# Patient Record
Sex: Female | Born: 1970
Health system: Southern US, Community
[De-identification: ages and names within clinical notes are randomized; demographics above are authoritative.]

## PROBLEM LIST (undated history)

## (undated) DIAGNOSIS — R519 Headache, unspecified: Secondary | ICD-10-CM

## (undated) DIAGNOSIS — N390 Urinary tract infection, site not specified: Secondary | ICD-10-CM

## (undated) DIAGNOSIS — K219 Gastro-esophageal reflux disease without esophagitis: Secondary | ICD-10-CM

## (undated) DIAGNOSIS — R51 Headache: Secondary | ICD-10-CM

## (undated) DIAGNOSIS — Z8619 Personal history of other infectious and parasitic diseases: Secondary | ICD-10-CM

## (undated) HISTORY — DX: Gastro-esophageal reflux disease without esophagitis: K21.9

## (undated) HISTORY — DX: Headache: R51

## (undated) HISTORY — DX: Urinary tract infection, site not specified: N39.0

## (undated) HISTORY — PX: MANDIBLE SURGERY: SHX707

## (undated) HISTORY — PX: TUBAL LIGATION: SHX77

## (undated) HISTORY — DX: Headache, unspecified: R51.9

## (undated) HISTORY — DX: Personal history of other infectious and parasitic diseases: Z86.19

---

## 2000-03-03 ENCOUNTER — Ambulatory Visit (HOSPITAL_COMMUNITY): Admission: RE | Admit: 2000-03-03 | Discharge: 2000-03-03 | Payer: Self-pay | Admitting: Obstetrics and Gynecology

## 2000-08-22 ENCOUNTER — Other Ambulatory Visit: Admission: RE | Admit: 2000-08-22 | Discharge: 2000-08-22 | Payer: Self-pay | Admitting: Obstetrics and Gynecology

## 2001-08-25 ENCOUNTER — Other Ambulatory Visit: Admission: RE | Admit: 2001-08-25 | Discharge: 2001-08-25 | Payer: Self-pay | Admitting: Obstetrics and Gynecology

## 2002-08-31 ENCOUNTER — Other Ambulatory Visit: Admission: RE | Admit: 2002-08-31 | Discharge: 2002-08-31 | Payer: Self-pay | Admitting: Obstetrics and Gynecology

## 2003-02-23 ENCOUNTER — Emergency Department (HOSPITAL_COMMUNITY): Admission: EM | Admit: 2003-02-23 | Discharge: 2003-02-23 | Payer: Self-pay | Admitting: *Deleted

## 2003-10-16 ENCOUNTER — Other Ambulatory Visit: Admission: RE | Admit: 2003-10-16 | Discharge: 2003-10-16 | Payer: Self-pay | Admitting: Obstetrics and Gynecology

## 2004-11-06 ENCOUNTER — Other Ambulatory Visit: Admission: RE | Admit: 2004-11-06 | Discharge: 2004-11-06 | Payer: Self-pay | Admitting: Obstetrics and Gynecology

## 2010-10-09 ENCOUNTER — Emergency Department (HOSPITAL_COMMUNITY): Payer: 59

## 2010-10-09 ENCOUNTER — Emergency Department (HOSPITAL_COMMUNITY)
Admission: EM | Admit: 2010-10-09 | Discharge: 2010-10-09 | Disposition: A | Discharge status: Home / Self Care | Payer: 59 | Attending: Emergency Medicine | Admitting: Emergency Medicine

## 2010-10-09 DIAGNOSIS — R10816 Epigastric abdominal tenderness: Secondary | ICD-10-CM | POA: Insufficient documentation

## 2010-10-09 DIAGNOSIS — M549 Dorsalgia, unspecified: Secondary | ICD-10-CM | POA: Insufficient documentation

## 2010-10-09 DIAGNOSIS — R1013 Epigastric pain: Secondary | ICD-10-CM | POA: Insufficient documentation

## 2010-10-09 DIAGNOSIS — R079 Chest pain, unspecified: Secondary | ICD-10-CM | POA: Insufficient documentation

## 2010-10-09 LAB — URINALYSIS, ROUTINE W REFLEX MICROSCOPIC
Bilirubin Urine: NEGATIVE
Ketones, ur: NEGATIVE mg/dL
Protein, ur: NEGATIVE mg/dL
Urine Glucose, Fasting: NEGATIVE mg/dL

## 2010-10-09 LAB — COMPREHENSIVE METABOLIC PANEL
ALT: 12 U/L (ref 0–35)
AST: 18 U/L (ref 0–37)
Albumin: 4.5 g/dL (ref 3.5–5.2)
Alkaline Phosphatase: 62 U/L (ref 39–117)
BUN: 7 mg/dL (ref 6–23)
Chloride: 105 mEq/L (ref 96–112)
GFR calc Af Amer: >60 mL/min (ref >60)
Potassium: 3.7 mEq/L (ref 3.5–5.1)
Sodium: 141 mEq/L (ref 135–145)
Total Bilirubin: 0.6 mg/dL (ref 0.3–1.2)
Total Protein: 7.3 g/dL (ref 6.0–8.3)

## 2010-10-09 LAB — POCT CARDIAC MARKERS
CKMB, poc: <1 ng/mL — ABNORMAL LOW (ref 1.0–8.0)
CKMB, poc: <1 ng/mL — ABNORMAL LOW (ref 1.0–8.0)
Myoglobin, poc: 29.5 ng/mL (ref 12–200)
Troponin i, poc: <0.05 ng/mL (ref 0.00–0.09)
Troponin i, poc: <0.05 ng/mL (ref 0.00–0.09)
Troponin i, poc: <0.05 ng/mL (ref 0.00–0.09)

## 2010-10-09 LAB — D-DIMER, QUANTITATIVE: D-Dimer, Quant: <0.22 ug/mL-FEU (ref 0.00–0.48)

## 2010-10-09 MED ORDER — IOHEXOL 300 MG/ML  SOLN: 100.0000 mL | Freq: Once | INTRAMUSCULAR | Status: DC | PRN

## 2016-01-22 DIAGNOSIS — Z01419 Encounter for gynecological examination (general) (routine) without abnormal findings: Secondary | ICD-10-CM | POA: Diagnosis not present

## 2016-01-22 DIAGNOSIS — Z6823 Body mass index (BMI) 23.0-23.9, adult: Secondary | ICD-10-CM | POA: Diagnosis not present

## 2016-01-22 DIAGNOSIS — Z1231 Encounter for screening mammogram for malignant neoplasm of breast: Secondary | ICD-10-CM | POA: Diagnosis not present

## 2016-02-02 DIAGNOSIS — Z0000 Encounter for general adult medical examination without abnormal findings: Secondary | ICD-10-CM | POA: Diagnosis not present

## 2016-02-05 DIAGNOSIS — Z0000 Encounter for general adult medical examination without abnormal findings: Secondary | ICD-10-CM | POA: Diagnosis not present

## 2016-06-22 DIAGNOSIS — H5213 Myopia, bilateral: Secondary | ICD-10-CM | POA: Diagnosis not present

## 2016-06-23 DIAGNOSIS — J069 Acute upper respiratory infection, unspecified: Secondary | ICD-10-CM | POA: Diagnosis not present

## 2017-04-20 DIAGNOSIS — Z01419 Encounter for gynecological examination (general) (routine) without abnormal findings: Secondary | ICD-10-CM | POA: Diagnosis not present

## 2017-04-20 DIAGNOSIS — Z1231 Encounter for screening mammogram for malignant neoplasm of breast: Secondary | ICD-10-CM | POA: Diagnosis not present

## 2017-04-20 DIAGNOSIS — Z6824 Body mass index (BMI) 24.0-24.9, adult: Secondary | ICD-10-CM | POA: Diagnosis not present

## 2017-04-20 LAB — HM PAP SMEAR

## 2017-04-20 LAB — HM MAMMOGRAPHY: HM MAMMO: NORMAL (ref 0–4)

## 2017-05-13 ENCOUNTER — Ambulatory Visit (INDEPENDENT_AMBULATORY_CARE_PROVIDER_SITE_OTHER): Payer: 59 | Admitting: Family Medicine

## 2017-05-13 ENCOUNTER — Encounter: Payer: Self-pay | Admitting: Family Medicine

## 2017-05-13 DIAGNOSIS — Z0000 Encounter for general adult medical examination without abnormal findings: Secondary | ICD-10-CM | POA: Diagnosis not present

## 2017-05-13 NOTE — Assessment & Plan Note (Signed)
Pt's PE WNL.  UTD on GYN and Tdap.  Check labs.  Anticipatory guidance provided.

## 2017-05-13 NOTE — Patient Instructions (Signed)
Follow up in 1 year or as needed We'll notify you of your lab results and make any changes if needed Keep up the good work on healthy diet and regular exercise- you look great! Call with any questions or concerns Welcome!  We're glad to have you!!! 

## 2017-05-13 NOTE — Progress Notes (Signed)
   Subjective:    Patient ID: Rhonda Dennis, female    DOB: 09/06/1970, 46 y.o.   MRN: [id number]  HPI New to establish.  Previous MD- Tollie Pizza ([facility])  GYN- [facility]  CPE- UTD on Tdap.  Will get flu shot at work.  No concerns today   Review of Systems Patient reports no vision/ hearing changes, adenopathy,fever, weight change,  persistant/recurrent hoarseness , swallowing issues, chest pain, palpitations, edema, persistant/recurrent cough, hemoptysis, dyspnea (rest/exertional/paroxysmal nocturnal), gastrointestinal bleeding (melena, rectal bleeding), abdominal pain, significant heartburn, bowel changes, GU symptoms (dysuria, hematuria, incontinence), Gyn symptoms (abnormal  bleeding, pain),  syncope, focal weakness, memory loss, numbness & tingling, skin/hair/nail changes, abnormal bruising or bleeding, anxiety, or depression.     Objective:   Physical Exam General Appearance:    Alert, cooperative, no distress, appears stated age  Head:    Normocephalic, without obvious abnormality, atraumatic  Eyes:    PERRL, conjunctiva/corneas clear, EOM's intact, fundi    benign, both eyes.  L upper eyelid overhangs L eye  Ears:    Normal TM's and external ear canals, both ears  Nose:   Nares normal, septum midline, mucosa normal, no drainage    or sinus tenderness  Throat:   Lips, mucosa, and tongue normal; teeth and gums normal  Neck:   Supple, symmetrical, trachea midline, no adenopathy;    Thyroid: no enlargement/tenderness/nodules  Back:     Symmetric, no curvature, ROM normal, no CVA tenderness  Lungs:     Clear to auscultation bilaterally, respirations unlabored  Chest Wall:    No tenderness or deformity   Heart:    Regular rate and rhythm, S1 and S2 normal, no murmur, rub   or gallop  Breast Exam:    Deferred to GYN  Abdomen:     Soft, non-tender, bowel sounds active all four quadrants,    no masses, no organomegaly  Genitalia:    Deferred to GYN  Rectal:    Extremities:    Extremities normal, atraumatic, no cyanosis or edema  Pulses:   2+ and symmetric all extremities  Skin:   Skin color, texture, turgor normal, no rashes or lesions  Lymph nodes:   Cervical, supraclavicular, and axillary nodes normal  Neurologic:   CNII-XII intact, normal strength, sensation and reflexes    throughout          Assessment & Plan:

## 2017-05-13 NOTE — Progress Notes (Signed)
Pre visit review using our clinic review tool, if applicable. No additional management support is needed unless otherwise documented below in the visit note. 

## 2017-05-14 LAB — HEPATIC FUNCTION PANEL
AG RATIO: 1.8 (calc) (ref 1.0–2.5)
ALT: 12 U/L (ref 6–29)
AST: 15 U/L (ref 10–35)
Albumin: 4.6 g/dL (ref 3.6–5.1)
Alkaline phosphatase (APISO): 70 U/L (ref 33–115)
BILIRUBIN TOTAL: 0.6 mg/dL (ref 0.2–1.2)
Bilirubin, Direct: 0.1 mg/dL (ref 0.0–0.2)
Globulin: 2.5 g/dL (calc) (ref 1.9–3.7)
Indirect Bilirubin: 0.5 mg/dL (calc) (ref 0.2–1.2)
TOTAL PROTEIN: 7.1 g/dL (ref 6.1–8.1)

## 2017-05-14 LAB — CBC
HCT: 40.7 % (ref 35.0–45.0)
HEMOGLOBIN: 13.5 g/dL (ref 11.7–15.5)
MCH: 30.3 pg (ref 27.0–33.0)
MCHC: 33.2 g/dL (ref 32.0–36.0)
MCV: 91.3 fL (ref 80.0–100.0)
MPV: 9.6 fL (ref 7.5–12.5)
Platelets: 358 10*3/uL (ref 140–400)
RBC: 4.46 10*6/uL (ref 3.80–5.10)
RDW: 12 % (ref 11.0–15.0)
WBC: 7.1 10*3/uL (ref 3.8–10.8)

## 2017-05-14 LAB — LIPID PANEL
CHOL/HDL RATIO: 2.7 (calc) (ref <5.0)
CHOLESTEROL: 193 mg/dL (ref <200)
HDL: 72 mg/dL (ref >50)
LDL Cholesterol (Calc): 104 mg/dL (calc) — ABNORMAL HIGH
NON-HDL CHOLESTEROL (CALC): 121 mg/dL (ref <130)
TRIGLYCERIDES: 83 mg/dL (ref <150)

## 2017-05-14 LAB — BASIC METABOLIC PANEL
BUN: 11 mg/dL (ref 7–25)
CALCIUM: 9.6 mg/dL (ref 8.6–10.2)
CO2: 28 mmol/L (ref 20–32)
Chloride: 101 mmol/L (ref 98–110)
Creat: 0.75 mg/dL (ref 0.50–1.10)
Glucose, Bld: 77 mg/dL (ref 65–99)
POTASSIUM: 4 mmol/L (ref 3.5–5.3)
SODIUM: 139 mmol/L (ref 135–146)

## 2017-05-14 LAB — TSH: TSH: 0.79 mIU/L

## 2017-05-16 ENCOUNTER — Encounter: Payer: Self-pay | Admitting: General Practice

## 2017-06-23 DIAGNOSIS — H5213 Myopia, bilateral: Secondary | ICD-10-CM | POA: Diagnosis not present

## 2017-06-23 DIAGNOSIS — H524 Presbyopia: Secondary | ICD-10-CM | POA: Diagnosis not present

## 2018-05-15 ENCOUNTER — Ambulatory Visit (INDEPENDENT_AMBULATORY_CARE_PROVIDER_SITE_OTHER): Payer: 59 | Admitting: Family Medicine

## 2018-05-15 ENCOUNTER — Encounter: Payer: Self-pay | Admitting: Family Medicine

## 2018-05-15 ENCOUNTER — Other Ambulatory Visit: Payer: Self-pay

## 2018-05-15 VITALS — BP 119/78 | HR 72 | Temp 97.9°F | Resp 16 | Ht 67.0 in | Wt 149.5 lb

## 2018-05-15 DIAGNOSIS — E785 Hyperlipidemia, unspecified: Secondary | ICD-10-CM

## 2018-05-15 DIAGNOSIS — Z0000 Encounter for general adult medical examination without abnormal findings: Secondary | ICD-10-CM

## 2018-05-15 LAB — HEPATIC FUNCTION PANEL
ALBUMIN: 4.4 g/dL (ref 3.5–5.2)
ALK PHOS: 77 U/L (ref 39–117)
ALT: 13 U/L (ref 0–35)
AST: 14 U/L (ref 0–37)
Bilirubin, Direct: 0.1 mg/dL (ref 0.0–0.3)
TOTAL PROTEIN: 6.8 g/dL (ref 6.0–8.3)
Total Bilirubin: 0.7 mg/dL (ref 0.2–1.2)

## 2018-05-15 LAB — CBC WITH DIFFERENTIAL/PLATELET
Basophils Absolute: 0 10*3/uL (ref 0.0–0.1)
Basophils Relative: 0.7 % (ref 0.0–3.0)
EOS PCT: 1.2 % (ref 0.0–5.0)
Eosinophils Absolute: 0.1 10*3/uL (ref 0.0–0.7)
HCT: 37.8 % (ref 36.0–46.0)
Hemoglobin: 12.9 g/dL (ref 12.0–15.0)
LYMPHS ABS: 1.7 10*3/uL (ref 0.7–4.0)
Lymphocytes Relative: 25.6 % (ref 12.0–46.0)
MCHC: 34 g/dL (ref 30.0–36.0)
MCV: 91.9 fl (ref 78.0–100.0)
Monocytes Absolute: 0.5 10*3/uL (ref 0.1–1.0)
Monocytes Relative: 7 % (ref 3.0–12.0)
NEUTROS ABS: 4.2 10*3/uL (ref 1.4–7.7)
NEUTROS PCT: 65.5 % (ref 43.0–77.0)
PLATELETS: 353 10*3/uL (ref 150.0–400.0)
RBC: 4.11 Mil/uL (ref 3.87–5.11)
RDW: 13.4 % (ref 11.5–15.5)
WBC: 6.5 10*3/uL (ref 4.0–10.5)

## 2018-05-15 LAB — TSH: TSH: 0.84 u[IU]/mL (ref 0.35–4.50)

## 2018-05-15 LAB — BASIC METABOLIC PANEL
BUN: 15 mg/dL (ref 6–23)
CHLORIDE: 103 meq/L (ref 96–112)
CO2: 28 mEq/L (ref 19–32)
Calcium: 9.5 mg/dL (ref 8.4–10.5)
Creatinine, Ser: 0.7 mg/dL (ref 0.40–1.20)
GFR: 95.19 mL/min (ref >60.00)
GLUCOSE: 81 mg/dL (ref 70–99)
POTASSIUM: 4 meq/L (ref 3.5–5.1)
Sodium: 138 mEq/L (ref 135–145)

## 2018-05-15 LAB — LIPID PANEL
Cholesterol: 170 mg/dL (ref 0–200)
HDL: 58.6 mg/dL (ref >39.00)
LDL Cholesterol: 100 mg/dL — ABNORMAL HIGH (ref 0–99)
NonHDL: 111.63
TRIGLYCERIDES: 57 mg/dL (ref 0.0–149.0)
Total CHOL/HDL Ratio: 3
VLDL: 11.4 mg/dL (ref 0.0–40.0)

## 2018-05-15 NOTE — Patient Instructions (Signed)
Follow up in 1 year or as needed We'll notify you of your lab results and make any changes if needed Keep up the good work on healthy diet and regular exercise- you look great!!! Call with any questions or concerns Enjoy your [facility] Status!!!

## 2018-05-15 NOTE — Progress Notes (Signed)
   Subjective:    Patient ID: Rhonda Dennis, female    DOB: 25-Aug-1970, 47 y.o.   MRN: [id number]  HPI CPE- UTD on pap.  Due for mammo.  Scheduled w/ GYN in Dec.  UTD on flu and Tdap.   Review of Systems Patient reports no vision/ hearing changes, adenopathy,fever, weight change,  persistant/recurrent hoarseness , swallowing issues, chest pain, palpitations, edema, persistant/recurrent cough, hemoptysis, dyspnea (rest/exertional/paroxysmal nocturnal), gastrointestinal bleeding (melena, rectal bleeding), abdominal pain, significant heartburn, bowel changes, GU symptoms (dysuria, hematuria, incontinence), Gyn symptoms (abnormal  bleeding, pain),  syncope, focal weakness, memory loss, numbness & tingling, skin/hair/nail changes, abnormal bruising or bleeding, anxiety, or depression.     Objective:   Physical Exam General Appearance:    Alert, cooperative, no distress, appears stated age  Head:    Normocephalic, without obvious abnormality, atraumatic  Eyes:    PERRL, conjunctiva/corneas clear, EOM's intact, fundi    benign, both eyes  Ears:    Normal TM's and external ear canals, both ears  Nose:   Nares normal, septum midline, mucosa normal, no drainage    or sinus tenderness  Throat:   Lips, mucosa, and tongue normal; teeth and gums normal  Neck:   Supple, symmetrical, trachea midline, no adenopathy;    Thyroid: no enlargement/tenderness/nodules  Back:     Symmetric, no curvature, ROM normal, no CVA tenderness  Lungs:     Clear to auscultation bilaterally, respirations unlabored  Chest Wall:    No tenderness or deformity   Heart:    Regular rate and rhythm, S1 and S2 normal, no murmur, rub   or gallop  Breast Exam:    Deferred to GYN  Abdomen:     Soft, non-tender, bowel sounds active all four quadrants,    no masses, no organomegaly  Genitalia:    Deferred to GYN  Rectal:    Extremities:   Extremities normal, atraumatic, no cyanosis or edema  Pulses:   2+ and symmetric all  extremities  Skin:   Skin color, texture, turgor normal, no rashes or lesions  Lymph nodes:   Cervical, supraclavicular, and axillary nodes normal  Neurologic:   CNII-XII intact, normal strength, sensation and reflexes    throughout          Assessment & Plan:

## 2018-05-15 NOTE — Assessment & Plan Note (Signed)
Pt's PE WNL.  UTD on GYN.  UTD on immunizations.  Check labs.  Anticipatory guidance provided.  

## 2018-05-16 ENCOUNTER — Encounter: Payer: Self-pay | Admitting: General Practice

## 2018-07-25 DIAGNOSIS — H524 Presbyopia: Secondary | ICD-10-CM | POA: Diagnosis not present

## 2018-07-25 DIAGNOSIS — H5213 Myopia, bilateral: Secondary | ICD-10-CM | POA: Diagnosis not present

## 2018-09-13 DIAGNOSIS — Z6825 Body mass index (BMI) 25.0-25.9, adult: Secondary | ICD-10-CM | POA: Diagnosis not present

## 2018-09-13 DIAGNOSIS — Z1231 Encounter for screening mammogram for malignant neoplasm of breast: Secondary | ICD-10-CM | POA: Diagnosis not present

## 2018-09-13 DIAGNOSIS — Z01419 Encounter for gynecological examination (general) (routine) without abnormal findings: Secondary | ICD-10-CM | POA: Diagnosis not present

## 2018-09-13 LAB — HM MAMMOGRAPHY

## 2018-09-18 ENCOUNTER — Other Ambulatory Visit: Payer: Self-pay | Admitting: Obstetrics and Gynecology

## 2018-09-18 DIAGNOSIS — R928 Other abnormal and inconclusive findings on diagnostic imaging of breast: Secondary | ICD-10-CM

## 2018-09-21 ENCOUNTER — Ambulatory Visit
Admission: RE | Admit: 2018-09-21 | Discharge: 2018-09-21 | Disposition: A | Discharge status: Home / Self Care | Payer: 59 | Source: Ambulatory Visit | Attending: Obstetrics and Gynecology | Admitting: Obstetrics and Gynecology

## 2018-09-21 ENCOUNTER — Ambulatory Visit: Payer: 59

## 2018-09-21 DIAGNOSIS — R928 Other abnormal and inconclusive findings on diagnostic imaging of breast: Secondary | ICD-10-CM

## 2018-09-21 DIAGNOSIS — R922 Inconclusive mammogram: Secondary | ICD-10-CM | POA: Diagnosis not present

## 2018-10-02 DIAGNOSIS — Z8042 Family history of malignant neoplasm of prostate: Secondary | ICD-10-CM | POA: Diagnosis not present

## 2018-10-02 DIAGNOSIS — Z801 Family history of malignant neoplasm of trachea, bronchus and lung: Secondary | ICD-10-CM | POA: Diagnosis not present

## 2018-10-02 DIAGNOSIS — Z8049 Family history of malignant neoplasm of other genital organs: Secondary | ICD-10-CM | POA: Diagnosis not present

## 2018-10-02 DIAGNOSIS — Z803 Family history of malignant neoplasm of breast: Secondary | ICD-10-CM | POA: Diagnosis not present

## 2019-05-18 ENCOUNTER — Ambulatory Visit (INDEPENDENT_AMBULATORY_CARE_PROVIDER_SITE_OTHER): Payer: 59 | Admitting: Family Medicine

## 2019-05-18 ENCOUNTER — Other Ambulatory Visit: Payer: Self-pay

## 2019-05-18 ENCOUNTER — Encounter: Payer: Self-pay | Admitting: Family Medicine

## 2019-05-18 VITALS — BP 117/81 | HR 76 | Temp 97.9°F | Resp 16 | Ht 67.0 in | Wt 154.2 lb

## 2019-05-18 DIAGNOSIS — E785 Hyperlipidemia, unspecified: Secondary | ICD-10-CM

## 2019-05-18 DIAGNOSIS — Z0000 Encounter for general adult medical examination without abnormal findings: Secondary | ICD-10-CM | POA: Diagnosis not present

## 2019-05-18 LAB — CBC WITH DIFFERENTIAL/PLATELET
Basophils Absolute: 0.1 10*3/uL (ref 0.0–0.1)
Basophils Relative: 0.8 % (ref 0.0–3.0)
Eosinophils Absolute: 0.1 10*3/uL (ref 0.0–0.7)
Eosinophils Relative: 0.9 % (ref 0.0–5.0)
HCT: 41.2 % (ref 36.0–46.0)
Hemoglobin: 13.8 g/dL (ref 12.0–15.0)
Lymphocytes Relative: 18.9 % (ref 12.0–46.0)
Lymphs Abs: 1.8 10*3/uL (ref 0.7–4.0)
MCHC: 33.6 g/dL (ref 30.0–36.0)
MCV: 92.4 fl (ref 78.0–100.0)
Monocytes Absolute: 0.5 10*3/uL (ref 0.1–1.0)
Monocytes Relative: 5.1 % (ref 3.0–12.0)
Neutro Abs: 7.2 10*3/uL (ref 1.4–7.7)
Neutrophils Relative %: 74.3 % (ref 43.0–77.0)
Platelets: 354 10*3/uL (ref 150.0–400.0)
RBC: 4.46 Mil/uL (ref 3.87–5.11)
RDW: 13.3 % (ref 11.5–15.5)
WBC: 9.7 10*3/uL (ref 4.0–10.5)

## 2019-05-18 LAB — BASIC METABOLIC PANEL
BUN: 10 mg/dL (ref 6–23)
CO2: 24 mEq/L (ref 19–32)
Calcium: 10.1 mg/dL (ref 8.4–10.5)
Chloride: 102 mEq/L (ref 96–112)
Creatinine, Ser: 0.7 mg/dL (ref 0.40–1.20)
GFR: 89.18 mL/min (ref >60.00)
Glucose, Bld: 76 mg/dL (ref 70–99)
Potassium: 4.2 mEq/L (ref 3.5–5.1)
Sodium: 138 mEq/L (ref 135–145)

## 2019-05-18 LAB — HEPATIC FUNCTION PANEL
ALT: 10 U/L (ref 0–35)
AST: 13 U/L (ref 0–37)
Albumin: 4.7 g/dL (ref 3.5–5.2)
Alkaline Phosphatase: 78 U/L (ref 39–117)
Bilirubin, Direct: 0.1 mg/dL (ref 0.0–0.3)
Total Bilirubin: 0.9 mg/dL (ref 0.2–1.2)
Total Protein: 7.1 g/dL (ref 6.0–8.3)

## 2019-05-18 LAB — LIPID PANEL
Cholesterol: 185 mg/dL (ref 0–200)
HDL: 59.6 mg/dL (ref >39.00)
LDL Cholesterol: 107 mg/dL — ABNORMAL HIGH (ref 0–99)
NonHDL: 125.29
Total CHOL/HDL Ratio: 3
Triglycerides: 90 mg/dL (ref 0.0–149.0)
VLDL: 18 mg/dL (ref 0.0–40.0)

## 2019-05-18 LAB — TSH: TSH: 0.94 u[IU]/mL (ref 0.35–4.50)

## 2019-05-18 NOTE — Patient Instructions (Signed)
Follow up in 1 year or as needed We'll notify you of your lab results and make any changes if needed Continue to work on healthy diet and regular exercise- you're doing great! Call with any questions or concerns Stay Safe!!! 

## 2019-05-18 NOTE — Progress Notes (Signed)
   Subjective:    Patient ID: Rhonda Dennis, female    DOB: 04/24/1971, 48 y.o.   MRN: [id number]  HPI CPE- UTD on pap, mammo, Tdap.  Had flu shot today at work.  No concerns.   Review of Systems Patient reports no vision/ hearing changes, adenopathy,fever, weight change,  persistant/recurrent hoarseness , swallowing issues, chest pain, palpitations, edema, persistant/recurrent cough, hemoptysis, dyspnea (rest/exertional/paroxysmal nocturnal), gastrointestinal bleeding (melena, rectal bleeding), abdominal pain, significant heartburn, bowel changes, GU symptoms (dysuria, hematuria, incontinence), Gyn symptoms (abnormal  bleeding, pain),  syncope, focal weakness, memory loss, numbness & tingling, skin/hair/nail changes, abnormal bruising or bleeding, anxiety, or depression.     Objective:   Physical Exam General Appearance:    Alert, cooperative, no distress, appears stated age  Head:    Normocephalic, without obvious abnormality, atraumatic  Eyes:    PERRL, conjunctiva/corneas clear, EOM's intact, fundi    benign, both eyes  Ears:    Normal TM's and external ear canals, both ears  Nose:   Deferred due to COVID  Throat:   Neck:   Supple, symmetrical, trachea midline, no adenopathy;    Thyroid: no enlargement/tenderness/nodules  Back:     Symmetric, no curvature, ROM normal, no CVA tenderness  Lungs:     Clear to auscultation bilaterally, respirations unlabored  Chest Wall:    No tenderness or deformity   Heart:    Regular rate and rhythm, S1 and S2 normal, no murmur, rub   or gallop  Breast Exam:    Deferred to GYN  Abdomen:     Soft, non-tender, bowel sounds active all four quadrants,    no masses, no organomegaly  Genitalia:    Deferred to GYN  Rectal:    Extremities:   Extremities normal, atraumatic, no cyanosis or edema  Pulses:   2+ and symmetric all extremities  Skin:   Skin color, texture, turgor normal, no rashes or lesions  Lymph nodes:   Cervical,  supraclavicular, and axillary nodes normal  Neurologic:   CNII-XII intact, normal strength, sensation and reflexes    throughout          Assessment & Plan:

## 2019-05-18 NOTE — Assessment & Plan Note (Signed)
Pt's PE WNL.  UTD on pap, mammo, immunizations.  Check labs.  Anticipatory guidance provided.

## 2019-05-21 ENCOUNTER — Encounter: Payer: Self-pay | Admitting: General Practice

## 2019-08-28 ENCOUNTER — Other Ambulatory Visit: Payer: Self-pay | Admitting: Obstetrics and Gynecology

## 2019-08-28 DIAGNOSIS — Z1231 Encounter for screening mammogram for malignant neoplasm of breast: Secondary | ICD-10-CM

## 2019-09-17 DIAGNOSIS — Z01419 Encounter for gynecological examination (general) (routine) without abnormal findings: Secondary | ICD-10-CM | POA: Diagnosis not present

## 2019-09-17 DIAGNOSIS — Z6825 Body mass index (BMI) 25.0-25.9, adult: Secondary | ICD-10-CM | POA: Diagnosis not present

## 2019-10-09 ENCOUNTER — Ambulatory Visit: Payer: 59

## 2019-11-13 ENCOUNTER — Ambulatory Visit
Admission: RE | Admit: 2019-11-13 | Discharge: 2019-11-13 | Disposition: A | Discharge status: Home / Self Care | Payer: 59 | Source: Ambulatory Visit | Attending: Obstetrics and Gynecology | Admitting: Obstetrics and Gynecology

## 2019-11-13 ENCOUNTER — Other Ambulatory Visit: Payer: Self-pay

## 2019-11-13 DIAGNOSIS — Z1231 Encounter for screening mammogram for malignant neoplasm of breast: Secondary | ICD-10-CM | POA: Diagnosis not present

## 2020-05-23 ENCOUNTER — Other Ambulatory Visit: Payer: Self-pay

## 2020-05-23 ENCOUNTER — Encounter: Payer: Self-pay | Admitting: Family Medicine

## 2020-05-23 ENCOUNTER — Ambulatory Visit (INDEPENDENT_AMBULATORY_CARE_PROVIDER_SITE_OTHER): Payer: 59 | Admitting: Family Medicine

## 2020-05-23 VITALS — BP 116/78 | HR 76 | Temp 98.0°F | Resp 16 | Ht 67.0 in | Wt 150.5 lb

## 2020-05-23 DIAGNOSIS — Z0000 Encounter for general adult medical examination without abnormal findings: Secondary | ICD-10-CM | POA: Diagnosis not present

## 2020-05-23 DIAGNOSIS — E785 Hyperlipidemia, unspecified: Secondary | ICD-10-CM | POA: Diagnosis not present

## 2020-05-23 DIAGNOSIS — Z1211 Encounter for screening for malignant neoplasm of colon: Secondary | ICD-10-CM

## 2020-05-23 LAB — HEPATIC FUNCTION PANEL
ALT: 11 U/L (ref 0–35)
AST: 12 U/L (ref 0–37)
Albumin: 4.4 g/dL (ref 3.5–5.2)
Alkaline Phosphatase: 86 U/L (ref 39–117)
Bilirubin, Direct: 0.1 mg/dL (ref 0.0–0.3)
Total Bilirubin: 0.5 mg/dL (ref 0.2–1.2)
Total Protein: 6.8 g/dL (ref 6.0–8.3)

## 2020-05-23 LAB — TSH: TSH: 1.2 u[IU]/mL (ref 0.35–4.50)

## 2020-05-23 LAB — BASIC METABOLIC PANEL
BUN: 12 mg/dL (ref 6–23)
CO2: 29 mEq/L (ref 19–32)
Calcium: 9.4 mg/dL (ref 8.4–10.5)
Chloride: 102 mEq/L (ref 96–112)
Creatinine, Ser: 0.81 mg/dL (ref 0.40–1.20)
GFR: 75.03 mL/min (ref >60.00)
Glucose, Bld: 82 mg/dL (ref 70–99)
Potassium: 4.2 mEq/L (ref 3.5–5.1)
Sodium: 138 mEq/L (ref 135–145)

## 2020-05-23 LAB — CBC WITH DIFFERENTIAL/PLATELET
Basophils Absolute: 0.1 10*3/uL (ref 0.0–0.1)
Basophils Relative: 1.4 % (ref 0.0–3.0)
Eosinophils Absolute: 0.1 10*3/uL (ref 0.0–0.7)
Eosinophils Relative: 1.8 % (ref 0.0–5.0)
HCT: 40.3 % (ref 36.0–46.0)
Hemoglobin: 13.6 g/dL (ref 12.0–15.0)
Lymphocytes Relative: 23.8 % (ref 12.0–46.0)
Lymphs Abs: 1.4 10*3/uL (ref 0.7–4.0)
MCHC: 33.8 g/dL (ref 30.0–36.0)
MCV: 92 fl (ref 78.0–100.0)
Monocytes Absolute: 0.3 10*3/uL (ref 0.1–1.0)
Monocytes Relative: 5.6 % (ref 3.0–12.0)
Neutro Abs: 4 10*3/uL (ref 1.4–7.7)
Neutrophils Relative %: 67.4 % (ref 43.0–77.0)
Platelets: 328 10*3/uL (ref 150.0–400.0)
RBC: 4.38 Mil/uL (ref 3.87–5.11)
RDW: 13.2 % (ref 11.5–15.5)
WBC: 6 10*3/uL (ref 4.0–10.5)

## 2020-05-23 LAB — LIPID PANEL
Cholesterol: 165 mg/dL (ref 0–200)
HDL: 56.7 mg/dL (ref >39.00)
LDL Cholesterol: 87 mg/dL (ref 0–99)
NonHDL: 108.59
Total CHOL/HDL Ratio: 3
Triglycerides: 109 mg/dL (ref 0.0–149.0)
VLDL: 21.8 mg/dL (ref 0.0–40.0)

## 2020-05-23 NOTE — Assessment & Plan Note (Signed)
Pt's PE WNL.  UTD on GYN, immunizations.  Due for colonoscopy- referral placed.  Check labs.  Anticipatory guidance provided.

## 2020-05-23 NOTE — Progress Notes (Signed)
   Subjective:    Patient ID: Rhonda Dennis, female    DOB: 1970-10-01, 49 y.o.   MRN: [id number]  HPI CPE- UTD pap, mammo, Tdap, COVID vaccines, flu.  Due for colonoscopy.  No concerns today.  Reviewed past medical, surgical, family and social histories.   Patient Care Team    Relationship Specialty Notifications Start End  Midge Minium, MD PCP - General Family Medicine  05/13/17   Arvella Nigh, MD Consulting Physician Obstetrics and Gynecology  05/13/17     Health Maintenance  Topic Date Due  . Hepatitis C Screening  05/23/2021 (Originally 01/16/1971)  . HIV Screening  05/23/2021 (Originally 01/05/1986)  . MAMMOGRAM  11/12/2020  . PAP SMEAR-Modifier  08/23/2022  . TETANUS/TDAP  12/24/2025  . INFLUENZA VACCINE  Completed      Review of Systems Patient reports no vision/ hearing changes, adenopathy,fever, weight change,  persistant/recurrent hoarseness , swallowing issues, chest pain, palpitations, edema, persistant/recurrent cough, hemoptysis, dyspnea (rest/exertional/paroxysmal nocturnal), gastrointestinal bleeding (melena, rectal bleeding), abdominal pain, significant heartburn, bowel changes, GU symptoms (dysuria, hematuria, incontinence), Gyn symptoms (abnormal  bleeding, pain),  syncope, focal weakness, memory loss, numbness & tingling, skin/hair/nail changes, abnormal bruising or bleeding, anxiety, or depression.   This visit occurred during the SARS-CoV-2 public health emergency.  Safety protocols were in place, including screening questions prior to the visit, additional usage of staff PPE, and extensive cleaning of exam room while observing appropriate contact time as indicated for disinfecting solutions.       Objective:   Physical Exam General Appearance:    Alert, cooperative, no distress, appears stated age  Head:    Normocephalic, without obvious abnormality, atraumatic  Eyes:    PERRL, conjunctiva/corneas clear, EOM's intact, fundi    benign, both  eyes  Ears:    Normal TM's and external ear canals, both ears  Nose:   Deferred due to COVID  Throat:   Neck:   Supple, symmetrical, trachea midline, no adenopathy;    Thyroid: no enlargement/tenderness/nodules  Back:     Symmetric, no curvature, ROM normal, no CVA tenderness  Lungs:     Clear to auscultation bilaterally, respirations unlabored  Chest Wall:    No tenderness or deformity   Heart:    Regular rate and rhythm, S1 and S2 normal, no murmur, rub   or gallop  Breast Exam:    Deferred to GYN  Abdomen:     Soft, non-tender, bowel sounds active all four quadrants,    no masses, no organomegaly  Genitalia:    Deferred to GYN  Rectal:    Extremities:   Extremities normal, atraumatic, no cyanosis or edema  Pulses:   2+ and symmetric all extremities  Skin:   Skin color, texture, turgor normal, no rashes or lesions  Lymph nodes:   Cervical, supraclavicular, and axillary nodes normal  Neurologic:   CNII-XII intact, normal strength, sensation and reflexes    throughout          Assessment & Plan:

## 2020-05-23 NOTE — Patient Instructions (Signed)
Follow up in 1 year or as needed We'll notify you of your lab results and make any changes if needed We'll call you with your GI appt Keep up the good work on healthy diet and regular exercise- you look great! Call with any questions or concerns Stay Safe!  Stay Healthy!!

## 2020-05-26 ENCOUNTER — Encounter: Payer: Self-pay | Admitting: General Practice

## 2020-05-29 ENCOUNTER — Other Ambulatory Visit (HOSPITAL_COMMUNITY): Payer: Self-pay | Admitting: Dermatology

## 2020-05-29 DIAGNOSIS — L918 Other hypertrophic disorders of the skin: Secondary | ICD-10-CM | POA: Diagnosis not present

## 2020-05-29 DIAGNOSIS — L814 Other melanin hyperpigmentation: Secondary | ICD-10-CM | POA: Diagnosis not present

## 2020-05-29 DIAGNOSIS — D225 Melanocytic nevi of trunk: Secondary | ICD-10-CM | POA: Diagnosis not present

## 2020-05-29 DIAGNOSIS — D1801 Hemangioma of skin and subcutaneous tissue: Secondary | ICD-10-CM | POA: Diagnosis not present

## 2020-05-29 MED FILL — IMIQUIMOD 5% CREAM PACKET: 5 | 56 days supply | Qty: 24 | Fill #0

## 2020-08-05 ENCOUNTER — Encounter: Payer: Self-pay | Admitting: Gastroenterology

## 2020-09-19 ENCOUNTER — Other Ambulatory Visit: Payer: Self-pay

## 2020-09-19 ENCOUNTER — Ambulatory Visit (AMBULATORY_SURGERY_CENTER): Payer: Self-pay | Admitting: *Deleted

## 2020-09-19 ENCOUNTER — Other Ambulatory Visit: Payer: Self-pay | Admitting: Gastroenterology

## 2020-09-19 VITALS — Ht 67.0 in | Wt 149.0 lb

## 2020-09-19 DIAGNOSIS — Z1211 Encounter for screening for malignant neoplasm of colon: Secondary | ICD-10-CM

## 2020-09-19 MED ORDER — NA SULFATE-K SULFATE-MG SULF 17.5-3.13-1.6 GM/177ML PO SOLN: ORAL | 0 refills | Status: DC

## 2020-09-19 MED FILL — SUPREP BOWEL PREP KIT: 17.5-3.13-1 | 2 days supply | Qty: 354 | Fill #0

## 2020-09-19 NOTE — Progress Notes (Signed)
Patient is here in-person for PV. Patient denies any allergies to eggs or soy. Patient denies any problems with anesthesia/sedation. Patient denies any oxygen use at home. Patient denies taking any diet/weight loss medications or blood thinners. Patient is not being treated for MRSA or C-diff. Patient is aware of our care-partner policy and BXUXY-33 safety protocol. EMMI education assigned to the patient for the procedure, sent to [facility].   COVID-19 vaccines completed on  07/2020 x3, per patient.

## 2020-10-09 ENCOUNTER — Other Ambulatory Visit: Payer: Self-pay | Admitting: Obstetrics and Gynecology

## 2020-10-09 DIAGNOSIS — Z1231 Encounter for screening mammogram for malignant neoplasm of breast: Secondary | ICD-10-CM

## 2020-10-14 DIAGNOSIS — Z01419 Encounter for gynecological examination (general) (routine) without abnormal findings: Secondary | ICD-10-CM | POA: Diagnosis not present

## 2020-10-14 DIAGNOSIS — Z6824 Body mass index (BMI) 24.0-24.9, adult: Secondary | ICD-10-CM | POA: Diagnosis not present

## 2020-10-15 ENCOUNTER — Encounter: Payer: Self-pay | Admitting: Gastroenterology

## 2020-10-17 ENCOUNTER — Encounter: Payer: Self-pay | Admitting: Gastroenterology

## 2020-10-17 ENCOUNTER — Other Ambulatory Visit: Payer: Self-pay

## 2020-10-17 ENCOUNTER — Ambulatory Visit (AMBULATORY_SURGERY_CENTER): Payer: 59 | Admitting: Gastroenterology

## 2020-10-17 VITALS — BP 99/68 | HR 70 | Temp 96.6°F | Resp 16 | Ht 67.0 in | Wt 149.0 lb

## 2020-10-17 DIAGNOSIS — Z1211 Encounter for screening for malignant neoplasm of colon: Secondary | ICD-10-CM | POA: Diagnosis not present

## 2020-10-17 MED ORDER — SODIUM CHLORIDE 0.9 % IV SOLN: 500.0000 mL | Freq: Once | INTRAVENOUS | Status: DC

## 2020-10-17 NOTE — Progress Notes (Signed)
pt tolerated well. VSS. awake and to recovery. Report given to RN.  

## 2020-10-17 NOTE — Patient Instructions (Signed)
YOU HAD AN ENDOSCOPIC PROCEDURE TODAY AT THE [facility]:   Refer to the procedure report that was given to you for any specific questions about what was found during the examination.  If the procedure report does not answer your questions, please call your gastroenterologist to clarify.  If you requested that your care partner not be given the details of your procedure findings, then the procedure report has been included in a sealed envelope for you to review at your convenience later.  YOU SHOULD EXPECT: Some feelings of bloating in the abdomen. Passage of more gas than usual.  Walking can help get rid of the air that was put into your GI tract during the procedure and reduce the bloating. If you had a lower endoscopy (such as a colonoscopy or flexible sigmoidoscopy) you may notice spotting of blood in your stool or on the toilet paper. If you underwent a bowel prep for your procedure, you may not have a normal bowel movement for a few days.  Please Note:  You might notice some irritation and congestion in your nose or some drainage.  This is from the oxygen used during your procedure.  There is no need for concern and it should clear up in a day or so.  SYMPTOMS TO REPORT IMMEDIATELY:   Following lower endoscopy (colonoscopy or flexible sigmoidoscopy):  Excessive amounts of blood in the stool  Significant tenderness or worsening of abdominal pains  Swelling of the abdomen that is new, acute  Fever of 100F or higher   Following upper endoscopy (EGD)  Vomiting of blood or coffee ground material  New chest pain or pain under the shoulder blades  Painful or persistently difficult swallowing  New shortness of breath  Fever of 100F or higher  Black, tarry-looking stools  For urgent or emergent issues, a gastroenterologist can be reached at any hour by calling [phone]. Do not use MyChart messaging for urgent concerns.    DIET:  We do recommend a small meal at first, but  then you may proceed to your regular diet.  Drink plenty of fluids but you should avoid alcoholic beverages for 24 hours.  ACTIVITY:  You should plan to take it easy for the rest of today and you should NOT DRIVE or use heavy machinery until tomorrow (because of the sedation medicines used during the test).    FOLLOW UP: Our staff will call the number listed on your records 48-72 hours following your procedure to check on you and address any questions or concerns that you may have regarding the information given to you following your procedure. If we do not reach you, we will leave a message.  We will attempt to reach you two times.  During this call, we will ask if you have developed any symptoms of COVID 19. If you develop any symptoms (ie: fever, flu-like symptoms, shortness of breath, cough etc.) before then, please call [phone].  If you test positive for Covid 19 in the 2 weeks post procedure, please call and report this information to us.    If any biopsies were taken you will be contacted by phone or by letter within the next 1-3 weeks.  Please call us at [phone] if you have not heard about the biopsies in 3 weeks.    SIGNATURES/CONFIDENTIALITY: You and/or your care partner have signed paperwork which will be entered into your electronic medical record.  These signatures attest to the fact that that the information above on   your After Visit Summary has been reviewed and is understood.  Full responsibility of the confidentiality of this discharge information lies with you and/or your care-partner. 

## 2020-10-17 NOTE — Op Note (Signed)
[facility] Patient Name: Rhonda Dennis Procedure Date: 10/17/2020 8:31 AM MRN: [id number] Endoscopist: Thornton Park MD, MD Age: 50 Referring MD:  Date of Birth: 1970/10/28 Gender: Female Account #: [id number] Procedure:                Colonoscopy Indications:              Screening for colorectal malignant neoplasm, This                            is the patient's first colonoscopy                           Maternal uncle with colon cancer in his 82s                           Mother may have had colon polyps Medicines:                Monitored Anesthesia Care Procedure:                Pre-Anesthesia Assessment:                           - Prior to the procedure, a History and Physical                            was performed, and patient medications and                            allergies were reviewed. The patient's tolerance of                            previous anesthesia was also reviewed. The risks                            and benefits of the procedure and the sedation                            options and risks were discussed with the patient.                            All questions were answered, and informed consent                            was obtained. Prior Anticoagulants: The patient has                            taken no previous anticoagulant or antiplatelet                            agents. ASA Grade Assessment: II - A patient with                            mild systemic disease. After reviewing the risks  and benefits, the patient was deemed in                            satisfactory condition to undergo the procedure.                           After obtaining informed consent, the colonoscope                            was passed under direct vision. Throughout the                            procedure, the patient's blood pressure, pulse, and                            oxygen saturations were monitored  continuously. The                            Olympus PCF-H190DL (#[id number]) Colonoscope was                            introduced through the anus and advanced to the 3                            cm into the ileum. A second forward view of the                            right colon was performed. The colonoscopy was                            performed without difficulty. The patient tolerated                            the procedure well. The quality of the bowel                            preparation was good. The terminal ileum, ileocecal                            valve, appendiceal orifice, and rectum were                            photographed. Scope In: 8:40:21 AM Scope Out: 8:53:16 AM Scope Withdrawal Time: 0 hours 10 minutes 9 seconds  Total Procedure Duration: 0 hours 12 minutes 55 seconds  Findings:                 The perianal and digital rectal examinations were                            normal.                           The entire examined colon appeared normal on direct  and retroflexion views. Complications:            No immediate complications. Estimated Blood Loss:     Estimated blood loss: none. Impression:               - The entire examined colon is normal on direct and                            retroflexion views.                           - No specimens collected. Recommendation:           - Patient has a contact number available for                            emergencies. The signs and symptoms of potential                            delayed complications were discussed with the                            patient. Return to normal activities tomorrow.                            Written discharge instructions were provided to the                            patient.                           - Resume previous diet.                           - Continue present medications.                           - Repeat colonoscopy in 5 years for  surveillance if                            your Mom has a history of colon polyps.                           - Emerging evidence supports eating a diet of                            fruits, vegetables, grains, calcium, and yogurt                            while reducing red meat and alcohol may reduce the                            risk of colon cancer.                           - Thank you for allowing me to be involved in your  colon cancer prevention. Thornton Park MD, MD 10/17/2020 8:57:29 AM This report has been signed electronically.

## 2020-10-17 NOTE — Progress Notes (Signed)
Pt's states no medical or surgical changes since previsit or office visit.  VS taken by SM 

## 2020-10-21 ENCOUNTER — Telehealth: Payer: Self-pay | Admitting: *Deleted

## 2020-10-21 LAB — HM PAP SMEAR: HPV, high-risk: NEGATIVE

## 2020-10-21 NOTE — Telephone Encounter (Signed)
  Follow up Call-  Call back number 10/17/2020  Post procedure Call Back phone  # [phone]  Permission to leave phone message Yes  Some recent data might be hidden     Patient questions:  Do you have a fever, pain , or abdominal swelling? No. Pain Score  0 *  Have you tolerated food without any problems? Yes.    Have you been able to return to your normal activities? Yes.    Do you have any questions about your discharge instructions: Diet   No. Medications  No. Follow up visit  No.  Do you have questions or concerns about your Care? No.  Actions: * If pain score is 4 or above: No action needed, pain <4.  1. Have you developed a fever since your procedure? no  2.   Have you had an respiratory symptoms (SOB or cough) since your procedure? no  3.   Have you tested positive for COVID 19 since your procedure no  4.   Have you had any family members/close contacts diagnosed with the COVID 19 since your procedure?  no   If yes to any of these questions please route to Joylene John, RN and Joella Prince, RN

## 2020-11-28 ENCOUNTER — Ambulatory Visit
Admission: RE | Admit: 2020-11-28 | Discharge: 2020-11-28 | Disposition: A | Discharge status: Home / Self Care | Payer: 59 | Source: Ambulatory Visit | Attending: Obstetrics and Gynecology | Admitting: Obstetrics and Gynecology

## 2020-11-28 ENCOUNTER — Other Ambulatory Visit: Payer: Self-pay

## 2020-11-28 DIAGNOSIS — Z1231 Encounter for screening mammogram for malignant neoplasm of breast: Secondary | ICD-10-CM | POA: Diagnosis not present

## 2020-12-04 DIAGNOSIS — D485 Neoplasm of uncertain behavior of skin: Secondary | ICD-10-CM | POA: Diagnosis not present

## 2021-02-18 ENCOUNTER — Encounter: Payer: Self-pay | Admitting: *Deleted

## 2021-02-25 DIAGNOSIS — H5213 Myopia, bilateral: Secondary | ICD-10-CM | POA: Diagnosis not present

## 2021-05-25 ENCOUNTER — Encounter: Payer: 59 | Admitting: Family Medicine

## 2021-06-02 ENCOUNTER — Encounter: Payer: 59 | Admitting: Family Medicine

## 2021-06-04 DIAGNOSIS — D225 Melanocytic nevi of trunk: Secondary | ICD-10-CM | POA: Diagnosis not present

## 2021-06-04 DIAGNOSIS — D1801 Hemangioma of skin and subcutaneous tissue: Secondary | ICD-10-CM | POA: Diagnosis not present

## 2021-06-04 DIAGNOSIS — L570 Actinic keratosis: Secondary | ICD-10-CM | POA: Diagnosis not present

## 2021-06-04 DIAGNOSIS — L918 Other hypertrophic disorders of the skin: Secondary | ICD-10-CM | POA: Diagnosis not present

## 2021-08-12 ENCOUNTER — Encounter: Payer: Self-pay | Admitting: Family Medicine

## 2021-08-12 ENCOUNTER — Ambulatory Visit (INDEPENDENT_AMBULATORY_CARE_PROVIDER_SITE_OTHER): Payer: 59 | Admitting: Family Medicine

## 2021-08-12 VITALS — BP 126/82 | HR 87 | Temp 97.7°F | Resp 16 | Ht 67.0 in | Wt 152.4 lb

## 2021-08-12 DIAGNOSIS — E785 Hyperlipidemia, unspecified: Secondary | ICD-10-CM

## 2021-08-12 DIAGNOSIS — Z0000 Encounter for general adult medical examination without abnormal findings: Secondary | ICD-10-CM

## 2021-08-12 LAB — CBC WITH DIFFERENTIAL/PLATELET
Basophils Absolute: 0 10*3/uL (ref 0.0–0.1)
Basophils Relative: 0.5 % (ref 0.0–3.0)
Eosinophils Absolute: 0.1 10*3/uL (ref 0.0–0.7)
Eosinophils Relative: 0.9 % (ref 0.0–5.0)
HCT: 38 % (ref 36.0–46.0)
Hemoglobin: 12.5 g/dL (ref 12.0–15.0)
Lymphocytes Relative: 15.4 % (ref 12.0–46.0)
Lymphs Abs: 1.1 10*3/uL (ref 0.7–4.0)
MCHC: 33 g/dL (ref 30.0–36.0)
MCV: 92 fl (ref 78.0–100.0)
Monocytes Absolute: 0.3 10*3/uL (ref 0.1–1.0)
Monocytes Relative: 4.7 % (ref 3.0–12.0)
Neutro Abs: 5.5 10*3/uL (ref 1.4–7.7)
Neutrophils Relative %: 78.5 % — ABNORMAL HIGH (ref 43.0–77.0)
Platelets: 381 10*3/uL (ref 150.0–400.0)
RBC: 4.13 Mil/uL (ref 3.87–5.11)
RDW: 13 % (ref 11.5–15.5)
WBC: 7.1 10*3/uL (ref 4.0–10.5)

## 2021-08-12 LAB — LIPID PANEL
Cholesterol: 164 mg/dL (ref 0–200)
HDL: 60.1 mg/dL (ref >39.00)
LDL Cholesterol: 81 mg/dL (ref 0–99)
NonHDL: 104.24
Total CHOL/HDL Ratio: 3
Triglycerides: 114 mg/dL (ref 0.0–149.0)
VLDL: 22.8 mg/dL (ref 0.0–40.0)

## 2021-08-12 LAB — BASIC METABOLIC PANEL
BUN: 12 mg/dL (ref 6–23)
CO2: 28 mEq/L (ref 19–32)
Calcium: 9.3 mg/dL (ref 8.4–10.5)
Chloride: 103 mEq/L (ref 96–112)
Creatinine, Ser: 0.73 mg/dL (ref 0.40–1.20)
GFR: 95.77 mL/min (ref >60.00)
Glucose, Bld: 81 mg/dL (ref 70–99)
Potassium: 4.4 mEq/L (ref 3.5–5.1)
Sodium: 137 mEq/L (ref 135–145)

## 2021-08-12 LAB — HEPATIC FUNCTION PANEL
ALT: 10 U/L (ref 0–35)
AST: 12 U/L (ref 0–37)
Albumin: 4.3 g/dL (ref 3.5–5.2)
Alkaline Phosphatase: 67 U/L (ref 39–117)
Bilirubin, Direct: 0.1 mg/dL (ref 0.0–0.3)
Total Bilirubin: 0.6 mg/dL (ref 0.2–1.2)
Total Protein: 6.7 g/dL (ref 6.0–8.3)

## 2021-08-12 LAB — TSH: TSH: 1.44 u[IU]/mL (ref 0.35–5.50)

## 2021-08-12 NOTE — Patient Instructions (Signed)
Follow up in 1 year or as needed We'll notify you of your lab results and make any changes if needed Continue to work on healthy diet and regular exercise- you look great! Call with any questions or concerns Stay Safe! Stay Healthy! Happy Holidays!!!  

## 2021-08-12 NOTE — Progress Notes (Signed)
° °  Subjective:    Patient ID: Rhonda Dennis, female    DOB: 1971/04/06, 50 y.o.   MRN: [id number]  HPI CPE- UTD on colonoscopy, mammo, pap, Tdap, flu.  No concerns today  Patient Care Team    Relationship Specialty Notifications Start End  Midge Minium, MD PCP - General Family Medicine  05/13/17   Arvella Nigh, MD Consulting Physician Obstetrics and Gynecology  05/13/17     Health Maintenance  Topic Date Due   HIV Screening  Never done   Hepatitis C Screening  Never done   Zoster Vaccines- Shingrix (1 of 2) Never done   MAMMOGRAM  11/28/2021   PAP SMEAR-Modifier  08/23/2022   COLONOSCOPY (Pts 45-65yrs Insurance coverage will need to be confirmed)  10/17/2025   TETANUS/TDAP  12/24/2025   INFLUENZA VACCINE  Completed   Pneumococcal Vaccine 28-70 Years old  Aged Out   HPV VACCINES  Aged Out      Review of Systems Patient reports no vision/ hearing changes, adenopathy,fever, weight change,  persistant/recurrent hoarseness , swallowing issues, chest pain, palpitations, edema, persistant/recurrent cough, hemoptysis, dyspnea (rest/exertional/paroxysmal nocturnal), gastrointestinal bleeding (melena, rectal bleeding), abdominal pain, significant heartburn, bowel changes, GU symptoms (dysuria, hematuria, incontinence), Gyn symptoms (abnormal  bleeding, pain),  syncope, focal weakness, memory loss, numbness & tingling, skin/hair/nail changes, abnormal bruising or bleeding, anxiety, or depression.   This visit occurred during the SARS-CoV-2 public health emergency.  Safety protocols were in place, including screening questions prior to the visit, additional usage of staff PPE, and extensive cleaning of exam room while observing appropriate contact time as indicated for disinfecting solutions.      Objective:   Physical Exam General Appearance:    Alert, cooperative, no distress, appears stated age  Head:    Normocephalic, without obvious abnormality, atraumatic  Eyes:     PERRL, conjunctiva/corneas clear, EOM's intact, fundi    benign, both eyes  Ears:    Normal TM's and external ear canals, both ears  Nose:   Deferred due to COVID  Throat:   Neck:   Supple, symmetrical, trachea midline, no adenopathy;    Thyroid: no enlargement/tenderness/nodules  Back:     Symmetric, no curvature, ROM normal, no CVA tenderness  Lungs:     Clear to auscultation bilaterally, respirations unlabored  Chest Wall:    No tenderness or deformity   Heart:    Regular rate and rhythm, S1 and S2 normal, no murmur, rub   or gallop  Breast Exam:    Deferred to GYN  Abdomen:     Soft, non-tender, bowel sounds active all four quadrants,    no masses, no organomegaly  Genitalia:    Deferred to GYN  Rectal:    Extremities:   Extremities normal, atraumatic, no cyanosis or edema  Pulses:   2+ and symmetric all extremities  Skin:   Skin color, texture, turgor normal, no rashes or lesions  Lymph nodes:   Cervical, supraclavicular, and axillary nodes normal  Neurologic:   CNII-XII intact, normal strength, sensation and reflexes    throughout          Assessment & Plan:

## 2021-08-12 NOTE — Assessment & Plan Note (Signed)
Pt's PE WNL.  UTD on pap, mammo, colonoscopy, Tdap, flu.  Check labs.  Anticipatory guidance provided.  

## 2021-08-25 DIAGNOSIS — L570 Actinic keratosis: Secondary | ICD-10-CM | POA: Diagnosis not present

## 2021-10-20 ENCOUNTER — Other Ambulatory Visit (HOSPITAL_BASED_OUTPATIENT_CLINIC_OR_DEPARTMENT_OTHER): Payer: Self-pay

## 2021-10-20 ENCOUNTER — Ambulatory Visit: Payer: 59 | Attending: Internal Medicine

## 2021-10-20 DIAGNOSIS — Z23 Encounter for immunization: Secondary | ICD-10-CM

## 2021-10-20 MED ORDER — PFIZER COVID-19 VAC BIVALENT 30 MCG/0.3ML IM SUSP
INTRAMUSCULAR | 0 refills | Status: DC
  Filled 2021-10-20: qty 0.3, 1d supply, fill #0

## 2021-10-20 NOTE — Progress Notes (Signed)
° °  Covid-19 Vaccination Clinic  Name:  Rhonda Dennis    MRN: [id number] DOB: 23-Apr-1971  10/20/2021  [name] was observed post Covid-19 immunization for 15 minutes without incident. She was provided with Vaccine Information Sheet and instruction to access the V-Safe system.   Ms. Forget was instructed to call 911 with any severe reactions post vaccine: Difficulty breathing  Swelling of face and throat  A fast heartbeat  A bad rash all over body  Dizziness and weakness   Immunizations Administered     Name Date Dose VIS Date Route   Pfizer Covid-19 Vaccine Bivalent Booster 10/20/2021 10:17 AM 0.3 mL 04/22/2021 Intramuscular   Manufacturer: [place]   Lot: [id number]   [place]: [phone]

## 2021-10-26 ENCOUNTER — Other Ambulatory Visit: Payer: Self-pay | Admitting: Obstetrics and Gynecology

## 2021-10-26 DIAGNOSIS — Z1231 Encounter for screening mammogram for malignant neoplasm of breast: Secondary | ICD-10-CM

## 2021-10-26 DIAGNOSIS — Z01419 Encounter for gynecological examination (general) (routine) without abnormal findings: Secondary | ICD-10-CM | POA: Diagnosis not present

## 2021-10-26 DIAGNOSIS — Z6824 Body mass index (BMI) 24.0-24.9, adult: Secondary | ICD-10-CM | POA: Diagnosis not present

## 2021-12-01 ENCOUNTER — Ambulatory Visit
Admission: RE | Admit: 2021-12-01 | Discharge: 2021-12-01 | Disposition: A | Discharge status: Home / Self Care | Payer: 59 | Source: Ambulatory Visit | Attending: Obstetrics and Gynecology | Admitting: Obstetrics and Gynecology

## 2021-12-01 DIAGNOSIS — Z1231 Encounter for screening mammogram for malignant neoplasm of breast: Secondary | ICD-10-CM

## 2021-12-03 DIAGNOSIS — G4733 Obstructive sleep apnea (adult) (pediatric): Secondary | ICD-10-CM | POA: Diagnosis not present

## 2021-12-03 DIAGNOSIS — E1169 Type 2 diabetes mellitus with other specified complication: Secondary | ICD-10-CM | POA: Diagnosis not present

## 2021-12-03 DIAGNOSIS — I10 Essential (primary) hypertension: Secondary | ICD-10-CM | POA: Diagnosis not present

## 2021-12-03 DIAGNOSIS — E1122 Type 2 diabetes mellitus with diabetic chronic kidney disease: Secondary | ICD-10-CM | POA: Diagnosis not present

## 2021-12-03 DIAGNOSIS — E1129 Type 2 diabetes mellitus with other diabetic kidney complication: Secondary | ICD-10-CM | POA: Diagnosis not present

## 2021-12-03 DIAGNOSIS — E669 Obesity, unspecified: Secondary | ICD-10-CM | POA: Diagnosis not present

## 2021-12-03 DIAGNOSIS — E349 Endocrine disorder, unspecified: Secondary | ICD-10-CM | POA: Diagnosis not present

## 2021-12-03 DIAGNOSIS — Z79899 Other long term (current) drug therapy: Secondary | ICD-10-CM | POA: Diagnosis not present

## 2021-12-03 DIAGNOSIS — E559 Vitamin D deficiency, unspecified: Secondary | ICD-10-CM | POA: Diagnosis not present

## 2021-12-11 ENCOUNTER — Other Ambulatory Visit (HOSPITAL_BASED_OUTPATIENT_CLINIC_OR_DEPARTMENT_OTHER): Payer: Self-pay

## 2021-12-11 MED ORDER — ZOSTER VAC RECOMB ADJUVANTED 50 MCG/0.5ML IM SUSR
INTRAMUSCULAR | 1 refills | Status: DC
  Filled 2021-12-11: qty 0.5, 1d supply, fill #0
  Filled 2022-04-09: qty 0.5, 1d supply, fill #1

## 2021-12-14 ENCOUNTER — Other Ambulatory Visit (HOSPITAL_BASED_OUTPATIENT_CLINIC_OR_DEPARTMENT_OTHER): Payer: Self-pay

## 2021-12-15 ENCOUNTER — Other Ambulatory Visit (HOSPITAL_BASED_OUTPATIENT_CLINIC_OR_DEPARTMENT_OTHER): Payer: Self-pay

## 2021-12-28 DIAGNOSIS — Z13820 Encounter for screening for osteoporosis: Secondary | ICD-10-CM | POA: Diagnosis not present

## 2022-04-09 ENCOUNTER — Other Ambulatory Visit (HOSPITAL_BASED_OUTPATIENT_CLINIC_OR_DEPARTMENT_OTHER): Payer: Self-pay

## 2022-05-10 DIAGNOSIS — H5213 Myopia, bilateral: Secondary | ICD-10-CM | POA: Diagnosis not present

## 2022-06-22 DIAGNOSIS — I10 Essential (primary) hypertension: Secondary | ICD-10-CM | POA: Diagnosis not present

## 2022-06-22 DIAGNOSIS — Z8249 Family history of ischemic heart disease and other diseases of the circulatory system: Secondary | ICD-10-CM | POA: Diagnosis not present

## 2022-06-22 DIAGNOSIS — I700 Atherosclerosis of aorta: Secondary | ICD-10-CM | POA: Diagnosis not present

## 2022-06-22 DIAGNOSIS — Z136 Encounter for screening for cardiovascular disorders: Secondary | ICD-10-CM | POA: Diagnosis not present

## 2022-06-22 DIAGNOSIS — Z0000 Encounter for general adult medical examination without abnormal findings: Secondary | ICD-10-CM | POA: Diagnosis not present

## 2022-07-26 ENCOUNTER — Encounter: Payer: Self-pay | Admitting: Family Medicine

## 2022-07-26 ENCOUNTER — Ambulatory Visit: Payer: 59 | Admitting: Family Medicine

## 2022-07-26 ENCOUNTER — Other Ambulatory Visit (HOSPITAL_BASED_OUTPATIENT_CLINIC_OR_DEPARTMENT_OTHER): Payer: Self-pay

## 2022-07-26 VITALS — BP 126/74 | HR 70 | Temp 98.0°F | Ht 67.0 in | Wt 153.0 lb

## 2022-07-26 DIAGNOSIS — R051 Acute cough: Secondary | ICD-10-CM | POA: Diagnosis not present

## 2022-07-26 LAB — POCT RESPIRATORY SYNCYTIAL VIRUS: RSV Rapid Ag: NEGATIVE

## 2022-07-26 MED ORDER — ALBUTEROL SULFATE HFA 108 (90 BASE) MCG/ACT IN AERS
2.0000 | INHALATION_SPRAY | Freq: Four times a day (QID) | RESPIRATORY_TRACT | 0 refills | Status: DC | PRN
  Filled 2022-07-26: qty 6.7, 25d supply, fill #0

## 2022-07-26 MED ORDER — PROMETHAZINE-DM 6.25-15 MG/5ML PO SYRP
5.0000 mL | ORAL_SOLUTION | Freq: Four times a day (QID) | ORAL | 0 refills | Status: DC | PRN
  Filled 2022-07-26: qty 240, 12d supply, fill #0

## 2022-07-26 MED ORDER — PREDNISONE 10 MG PO TABS
ORAL_TABLET | ORAL | 0 refills | Status: DC
  Filled 2022-07-26: qty 18, 9d supply, fill #0

## 2022-07-26 NOTE — Progress Notes (Signed)
   Subjective:    Patient ID: Rhonda Dennis, female    DOB: 27-Sep-1970, 50 y.o.   MRN: [id number]  HPI Cough- 'it just won't go away'.  Sxs started ~10 days ago.  Son and granddaughters were recently sick.  On Wednesday cough worsened- is now having deep hacking cough that is not productive.  Pt will gag w/ coughing.  No fever, sore throat, congestion, sinus pressure.  Denies SOB- able to take a deep breath.  Is able to sleep at night w/ NyQuil.     Review of Systems For ROS see HPI     Objective:   Physical Exam Vitals reviewed.  Constitutional:      General: She is not in acute distress.    Appearance: Normal appearance. She is not ill-appearing.  HENT:     Head: Normocephalic and atraumatic.  Eyes:     Extraocular Movements: Extraocular movements intact.     Conjunctiva/sclera: Conjunctivae normal.     Pupils: Pupils are equal, round, and reactive to light.  Cardiovascular:     Rate and Rhythm: Normal rate and regular rhythm.  Pulmonary:     Effort: Pulmonary effort is normal. No respiratory distress.     Breath sounds: Normal breath sounds. No wheezing, rhonchi or rales.     Comments: + hacking cough Musculoskeletal:     Cervical back: Normal range of motion and neck supple.  Lymphadenopathy:     Cervical: No cervical adenopathy.  Skin:    General: Skin is warm and dry.  Neurological:     General: No focal deficit present.     Mental Status: She is alert and oriented to person, place, and time.           Assessment & Plan:   Cough- new.  Pt's sxs started 10 days ago and worsened 5 days ago.  She has no other sxs- no sore throat, fatigue, body aches, fever, sinus pressure, ear pain, congestion.  Suspect this is post-viral cough and will treat w/ Albuterol and Prednisone taper.  Reviewed supportive care and red flags that should prompt return.  Pt expressed understanding and is in agreement w/ plan.

## 2022-07-26 NOTE — Patient Instructions (Signed)
Follow up as needed or as scheduled START the Prednisone as directed- take w/ food USE the inhaler if you have a coughing jag or as needed TAKE the cough syrup as needed- may cause drowsiness Drink LOTS of fluids REST! Call with any questions or concerns Hang in there!!!

## 2022-08-13 ENCOUNTER — Ambulatory Visit (INDEPENDENT_AMBULATORY_CARE_PROVIDER_SITE_OTHER): Payer: 59 | Admitting: Family Medicine

## 2022-08-13 ENCOUNTER — Encounter: Payer: Self-pay | Admitting: Family Medicine

## 2022-08-13 VITALS — BP 124/80 | HR 79 | Temp 98.8°F | Resp 17 | Ht 67.0 in | Wt 153.0 lb

## 2022-08-13 DIAGNOSIS — E785 Hyperlipidemia, unspecified: Secondary | ICD-10-CM

## 2022-08-13 DIAGNOSIS — Z0000 Encounter for general adult medical examination without abnormal findings: Secondary | ICD-10-CM

## 2022-08-13 LAB — CBC WITH DIFFERENTIAL/PLATELET
Basophils Absolute: 0.1 10*3/uL (ref 0.0–0.1)
Basophils Relative: 0.8 % (ref 0.0–3.0)
Eosinophils Absolute: 0.1 10*3/uL (ref 0.0–0.7)
Eosinophils Relative: 1.5 % (ref 0.0–5.0)
HCT: 41.5 % (ref 36.0–46.0)
Hemoglobin: 14 g/dL (ref 12.0–15.0)
Lymphocytes Relative: 16.5 % (ref 12.0–46.0)
Lymphs Abs: 1.3 10*3/uL (ref 0.7–4.0)
MCHC: 33.6 g/dL (ref 30.0–36.0)
MCV: 89.6 fl (ref 78.0–100.0)
Monocytes Absolute: 0.5 10*3/uL (ref 0.1–1.0)
Monocytes Relative: 6.1 % (ref 3.0–12.0)
Neutro Abs: 6 10*3/uL (ref 1.4–7.7)
Neutrophils Relative %: 75.1 % (ref 43.0–77.0)
Platelets: 357 10*3/uL (ref 150.0–400.0)
RBC: 4.64 Mil/uL (ref 3.87–5.11)
RDW: 13.3 % (ref 11.5–15.5)
WBC: 8 10*3/uL (ref 4.0–10.5)

## 2022-08-13 LAB — BASIC METABOLIC PANEL
BUN: 13 mg/dL (ref 6–23)
CO2: 29 mEq/L (ref 19–32)
Calcium: 9.8 mg/dL (ref 8.4–10.5)
Chloride: 102 mEq/L (ref 96–112)
Creatinine, Ser: 0.81 mg/dL (ref 0.40–1.20)
GFR: 83.94 mL/min (ref >60.00)
Glucose, Bld: 88 mg/dL (ref 70–99)
Potassium: 4.1 mEq/L (ref 3.5–5.1)
Sodium: 138 mEq/L (ref 135–145)

## 2022-08-13 LAB — LIPID PANEL
Cholesterol: 213 mg/dL — ABNORMAL HIGH (ref 0–200)
HDL: 66.9 mg/dL (ref >39.00)
LDL Cholesterol: 116 mg/dL — ABNORMAL HIGH (ref 0–99)
NonHDL: 146.1
Total CHOL/HDL Ratio: 3
Triglycerides: 153 mg/dL — ABNORMAL HIGH (ref 0.0–149.0)
VLDL: 30.6 mg/dL (ref 0.0–40.0)

## 2022-08-13 LAB — HEPATIC FUNCTION PANEL
ALT: 27 U/L (ref 0–35)
AST: 19 U/L (ref 0–37)
Albumin: 4.6 g/dL (ref 3.5–5.2)
Alkaline Phosphatase: 83 U/L (ref 39–117)
Bilirubin, Direct: 0.1 mg/dL (ref 0.0–0.3)
Total Bilirubin: 0.5 mg/dL (ref 0.2–1.2)
Total Protein: 7.2 g/dL (ref 6.0–8.3)

## 2022-08-13 NOTE — Assessment & Plan Note (Signed)
Pt's PE WNL.  UTD on pap, mammo, colonoscopy, immunizations.  Check labs.  Anticipatory guidance provided.  

## 2022-08-13 NOTE — Patient Instructions (Signed)
Follow up in 1 year or as needed We'll notify you of your lab results and make any changes if needed Keep up the good work on healthy diet and regular exercise- you look great!!! Call with any questions or concerns Stay Safe!  Stay Healthy! Happy Holidays!!!

## 2022-08-13 NOTE — Progress Notes (Signed)
   Subjective:    Patient ID: Rhonda Dennis, female    DOB: 07/01/1971, 51 y.o.   MRN: [id number]  HPI CPE- UTD on mammo, colonoscopy, Tdap, flu.  Declines HIV and hep C screen.  No concerns today  Patient Care Team    Relationship Specialty Notifications Start End  Midge Minium, MD PCP - General Family Medicine  05/13/17   Arvella Nigh, MD Consulting Physician Obstetrics and Gynecology  05/13/17     Health Maintenance  Topic Date Due   PAP SMEAR-Modifier  08/23/2022   Hepatitis C Screening  07/27/2023 (Originally 01/05/1989)   HIV Screening  07/27/2023 (Originally 01/05/1986)   MAMMOGRAM  12/02/2022   COLONOSCOPY (Pts 45-67yr Insurance coverage will need to be confirmed)  10/17/2025   DTaP/Tdap/Td (2 - Td or Tdap) 12/24/2025   INFLUENZA VACCINE  Completed   Zoster Vaccines- Shingrix  Completed   HPV VACCINES  Aged Out   COVID-19 Vaccine  Discontinued      Review of Systems Patient reports no vision/ hearing changes, adenopathy,fever, weight change,  persistant/recurrent hoarseness , swallowing issues, chest pain, palpitations, edema, persistant/recurrent cough, hemoptysis, dyspnea (rest/exertional/paroxysmal nocturnal), gastrointestinal bleeding (melena, rectal bleeding), abdominal pain, significant heartburn, bowel changes, GU symptoms (dysuria, hematuria, incontinence), Gyn symptoms (abnormal  bleeding, pain),  syncope, focal weakness, memory loss, numbness & tingling, skin/hair/nail changes, abnormal bruising or bleeding, anxiety, or depression.     Objective:   Physical Exam General Appearance:    Alert, cooperative, no distress, appears stated age  Head:    Normocephalic, without obvious abnormality, atraumatic  Eyes:    PERRL, conjunctiva/corneas clear, EOM's intact both eyes  Ears:    Normal TM's and external ear canals, both ears  Nose:   Nares normal, septum midline, mucosa normal, no drainage    or sinus tenderness  Throat:   Lips, mucosa, and tongue  normal; teeth and gums normal  Neck:   Supple, symmetrical, trachea midline, no adenopathy;    Thyroid: no enlargement/tenderness/nodules  Back:     Symmetric, no curvature, ROM normal, no CVA tenderness  Lungs:     Clear to auscultation bilaterally, respirations unlabored  Chest Wall:    No tenderness or deformity   Heart:    Regular rate and rhythm, S1 and S2 normal, no murmur, rub   or gallop  Breast Exam:    Deferred to GYN  Abdomen:     Soft, non-tender, bowel sounds active all four quadrants,    no masses, no organomegaly  Genitalia:    Deferred to GYN  Rectal:    Extremities:   Extremities normal, atraumatic, no cyanosis or edema  Pulses:   2+ and symmetric all extremities  Skin:   Skin color, texture, turgor normal, no rashes or lesions  Lymph nodes:   Cervical, supraclavicular, and axillary nodes normal  Neurologic:   CNII-XII intact, normal strength, sensation and reflexes    throughout          Assessment & Plan:

## 2022-08-14 LAB — TSH: TSH: 1.4 u[IU]/mL (ref 0.35–5.50)

## 2022-10-19 ENCOUNTER — Encounter: Payer: Self-pay | Admitting: Family Medicine

## 2022-11-12 ENCOUNTER — Other Ambulatory Visit: Payer: Self-pay | Admitting: Obstetrics and Gynecology

## 2022-11-12 DIAGNOSIS — Z1231 Encounter for screening mammogram for malignant neoplasm of breast: Secondary | ICD-10-CM

## 2022-11-17 DIAGNOSIS — Z6826 Body mass index (BMI) 26.0-26.9, adult: Secondary | ICD-10-CM | POA: Diagnosis not present

## 2022-11-17 DIAGNOSIS — Z01419 Encounter for gynecological examination (general) (routine) without abnormal findings: Secondary | ICD-10-CM | POA: Diagnosis not present

## 2022-12-22 ENCOUNTER — Other Ambulatory Visit (HOSPITAL_BASED_OUTPATIENT_CLINIC_OR_DEPARTMENT_OTHER): Payer: Self-pay | Admitting: Obstetrics and Gynecology

## 2022-12-22 ENCOUNTER — Encounter (HOSPITAL_BASED_OUTPATIENT_CLINIC_OR_DEPARTMENT_OTHER): Payer: Self-pay | Admitting: Obstetrics and Gynecology

## 2022-12-22 DIAGNOSIS — Z8249 Family history of ischemic heart disease and other diseases of the circulatory system: Secondary | ICD-10-CM

## 2022-12-28 ENCOUNTER — Ambulatory Visit
Admission: RE | Admit: 2022-12-28 | Discharge: 2022-12-28 | Disposition: A | Discharge status: Home / Self Care | Payer: 59 | Source: Ambulatory Visit | Attending: Obstetrics and Gynecology | Admitting: Obstetrics and Gynecology

## 2022-12-28 DIAGNOSIS — Z1231 Encounter for screening mammogram for malignant neoplasm of breast: Secondary | ICD-10-CM

## 2023-01-27 ENCOUNTER — Ambulatory Visit (HOSPITAL_BASED_OUTPATIENT_CLINIC_OR_DEPARTMENT_OTHER)
Admission: RE | Admit: 2023-01-27 | Discharge: 2023-01-27 | Disposition: A | Discharge status: Home / Self Care | Payer: 59 | Source: Ambulatory Visit | Attending: Obstetrics and Gynecology | Admitting: Obstetrics and Gynecology

## 2023-01-27 DIAGNOSIS — Z8249 Family history of ischemic heart disease and other diseases of the circulatory system: Secondary | ICD-10-CM | POA: Insufficient documentation

## 2023-08-15 ENCOUNTER — Ambulatory Visit (INDEPENDENT_AMBULATORY_CARE_PROVIDER_SITE_OTHER): Payer: 59 | Admitting: Family Medicine

## 2023-08-15 ENCOUNTER — Encounter: Payer: Self-pay | Admitting: Family Medicine

## 2023-08-15 VITALS — BP 130/74 | HR 69 | Temp 97.9°F | Ht 65.5 in | Wt 156.1 lb

## 2023-08-15 DIAGNOSIS — E785 Hyperlipidemia, unspecified: Secondary | ICD-10-CM

## 2023-08-15 DIAGNOSIS — Z0000 Encounter for general adult medical examination without abnormal findings: Secondary | ICD-10-CM | POA: Diagnosis not present

## 2023-08-15 DIAGNOSIS — Z1159 Encounter for screening for other viral diseases: Secondary | ICD-10-CM | POA: Diagnosis not present

## 2023-08-15 DIAGNOSIS — Z114 Encounter for screening for human immunodeficiency virus [HIV]: Secondary | ICD-10-CM | POA: Diagnosis not present

## 2023-08-15 LAB — BASIC METABOLIC PANEL
BUN: 17 mg/dL (ref 6–23)
CO2: 29 meq/L (ref 19–32)
Calcium: 9.3 mg/dL (ref 8.4–10.5)
Chloride: 103 meq/L (ref 96–112)
Creatinine, Ser: 0.86 mg/dL (ref 0.40–1.20)
GFR: 77.57 mL/min (ref >60.00)
Glucose, Bld: 94 mg/dL (ref 70–99)
Potassium: 4 meq/L (ref 3.5–5.1)
Sodium: 140 meq/L (ref 135–145)

## 2023-08-15 LAB — HEPATIC FUNCTION PANEL
ALT: 16 U/L (ref 0–35)
AST: 17 U/L (ref 0–37)
Albumin: 4.6 g/dL (ref 3.5–5.2)
Alkaline Phosphatase: 83 U/L (ref 39–117)
Bilirubin, Direct: 0.1 mg/dL (ref 0.0–0.3)
Total Bilirubin: 0.8 mg/dL (ref 0.2–1.2)
Total Protein: 7.1 g/dL (ref 6.0–8.3)

## 2023-08-15 LAB — CBC WITH DIFFERENTIAL/PLATELET
Basophils Absolute: 0 10*3/uL (ref 0.0–0.1)
Basophils Relative: 0.9 % (ref 0.0–3.0)
Eosinophils Absolute: 0.3 10*3/uL (ref 0.0–0.7)
Eosinophils Relative: 6.7 % — ABNORMAL HIGH (ref 0.0–5.0)
HCT: 40.5 % (ref 36.0–46.0)
Hemoglobin: 13.4 g/dL (ref 12.0–15.0)
Lymphocytes Relative: 28.4 % (ref 12.0–46.0)
Lymphs Abs: 1.4 10*3/uL (ref 0.7–4.0)
MCHC: 33.1 g/dL (ref 30.0–36.0)
MCV: 93 fL (ref 78.0–100.0)
Monocytes Absolute: 0.3 10*3/uL (ref 0.1–1.0)
Monocytes Relative: 5.5 % (ref 3.0–12.0)
Neutro Abs: 2.9 10*3/uL (ref 1.4–7.7)
Neutrophils Relative %: 58.5 % (ref 43.0–77.0)
Platelets: 350 10*3/uL (ref 150.0–400.0)
RBC: 4.35 Mil/uL (ref 3.87–5.11)
RDW: 13.2 % (ref 11.5–15.5)
WBC: 5 10*3/uL (ref 4.0–10.5)

## 2023-08-15 LAB — LIPID PANEL
Cholesterol: 188 mg/dL (ref 0–200)
HDL: 66 mg/dL (ref >39.00)
LDL Cholesterol: 103 mg/dL — ABNORMAL HIGH (ref 0–99)
NonHDL: 121.99
Total CHOL/HDL Ratio: 3
Triglycerides: 93 mg/dL (ref 0.0–149.0)
VLDL: 18.6 mg/dL (ref 0.0–40.0)

## 2023-08-15 LAB — TSH: TSH: 1.37 u[IU]/mL (ref 0.35–5.50)

## 2023-08-15 NOTE — Patient Instructions (Signed)
Follow up in 1 year or as needed  We'll notify you of your lab results and make any changes if needed Keep up the good work on healthy diet and regular exercise- you're doing great! Call with any questions or concerns Stay Safe!  Stay Healthy! Happy Holidays!!!

## 2023-08-15 NOTE — Assessment & Plan Note (Signed)
Pt's PE WNL.  UTD on pap, mammo, colonoscopy, immunizations.  Check labs.  Anticipatory guidance provided.  

## 2023-08-15 NOTE — Progress Notes (Signed)
   Subjective:    Patient ID: Rhonda Dennis, female    DOB: Feb 17, 1971, 52 y.o.   MRN: [id number]  HPI CPE- UTD on mammo, colonoscopy, pap, Tdap, flu.  Pt reports doing well.  Patient Care Team    Relationship Specialty Notifications Start End  Sheliah Hatch, MD PCP - General Family Medicine  05/13/17   Richardean Chimera, MD Consulting Physician Obstetrics and Gynecology  05/13/17     Health Maintenance  Topic Date Due   HIV Screening  Never done   Hepatitis C Screening  Never done   MAMMOGRAM  12/28/2023   Colonoscopy  10/17/2025   Cervical Cancer Screening (HPV/Pap Cotest)  10/21/2025   DTaP/Tdap/Td (2 - Td or Tdap) 12/24/2025   INFLUENZA VACCINE  Completed   Zoster Vaccines- Shingrix  Completed   HPV VACCINES  Aged Out   COVID-19 Vaccine  Discontinued      Review of Systems Patient reports no vision/ hearing changes, adenopathy,fever, weight change,  persistant/recurrent hoarseness , swallowing issues, chest pain, palpitations, edema, persistant/recurrent cough, hemoptysis, dyspnea (rest/exertional/paroxysmal nocturnal), gastrointestinal bleeding (melena, rectal bleeding), abdominal pain, significant heartburn, bowel changes, GU symptoms (dysuria, hematuria, incontinence), Gyn symptoms (abnormal  bleeding, pain),  syncope, focal weakness, memory loss, numbness & tingling, skin/hair/nail changes, abnormal bruising or bleeding, anxiety, or depression.     Objective:   Physical Exam General Appearance:    Alert, cooperative, no distress, appears stated age  Head:    Normocephalic, without obvious abnormality, atraumatic  Eyes:    PERRL, conjunctiva/corneas clear, EOM's intact both eyes  Ears:    Normal TM's and external ear canals, both ears  Nose:   Nares normal, septum midline, mucosa normal, no drainage    or sinus tenderness  Throat:   Lips, mucosa, and tongue normal; teeth and gums normal  Neck:   Supple, symmetrical, trachea midline, no adenopathy;    Thyroid:  no enlargement/tenderness/nodules  Back:     Symmetric, no curvature, ROM normal, no CVA tenderness  Lungs:     Clear to auscultation bilaterally, respirations unlabored  Chest Wall:    No tenderness or deformity   Heart:    Regular rate and rhythm, S1 and S2 normal, no murmur, rub   or gallop  Breast Exam:    Deferred to mammo  Abdomen:     Soft, non-tender, bowel sounds active all four quadrants,    no masses, no organomegaly  Genitalia:    Deferred  Rectal:    Extremities:   Extremities normal, atraumatic, no cyanosis or edema  Pulses:   2+ and symmetric all extremities  Skin:   Skin color, texture, turgor normal, no rashes or lesions  Lymph nodes:   Cervical, supraclavicular, and axillary nodes normal  Neurologic:   CNII-XII intact, normal strength, sensation and reflexes    throughout          Assessment & Plan:

## 2023-08-16 ENCOUNTER — Encounter: Payer: Self-pay | Admitting: Family Medicine

## 2023-08-16 ENCOUNTER — Telehealth: Payer: Self-pay

## 2023-08-16 LAB — HIV ANTIBODY (ROUTINE TESTING W REFLEX): HIV 1&2 Ab, 4th Generation: NONREACTIVE

## 2023-08-16 LAB — HEPATITIS C ANTIBODY: Hepatitis C Ab: NONREACTIVE

## 2023-08-16 NOTE — Telephone Encounter (Signed)
-----   Message from Neena Rhymes sent at 08/16/2023  7:28 AM EST ----- Labs look great!  No changes at this time

## 2023-08-16 NOTE — Telephone Encounter (Signed)
Pt has reviewed via MyChart

## 2023-09-26 DIAGNOSIS — L820 Inflamed seborrheic keratosis: Secondary | ICD-10-CM | POA: Diagnosis not present

## 2023-09-26 DIAGNOSIS — L918 Other hypertrophic disorders of the skin: Secondary | ICD-10-CM | POA: Diagnosis not present

## 2023-09-26 DIAGNOSIS — D225 Melanocytic nevi of trunk: Secondary | ICD-10-CM | POA: Diagnosis not present

## 2023-09-26 DIAGNOSIS — D2272 Melanocytic nevi of left lower limb, including hip: Secondary | ICD-10-CM | POA: Diagnosis not present

## 2023-09-26 DIAGNOSIS — D1801 Hemangioma of skin and subcutaneous tissue: Secondary | ICD-10-CM | POA: Diagnosis not present

## 2023-09-26 DIAGNOSIS — L814 Other melanin hyperpigmentation: Secondary | ICD-10-CM | POA: Diagnosis not present

## 2023-09-26 DIAGNOSIS — L738 Other specified follicular disorders: Secondary | ICD-10-CM | POA: Diagnosis not present

## 2023-09-26 DIAGNOSIS — D2372 Other benign neoplasm of skin of left lower limb, including hip: Secondary | ICD-10-CM | POA: Diagnosis not present

## 2023-10-09 IMAGING — MG MM DIGITAL SCREENING BILAT W/ TOMO AND CAD
8 series · 8 of 24 positions shown · non-contrast
Comparison: Previous exam(s).

CLINICAL DATA: Screening.

EXAM:
DIGITAL SCREENING BILATERAL MAMMOGRAM WITH TOMOSYNTHESIS AND CAD
TECHNIQUE: Bilateral screening digital craniocaudal and mediolateral oblique
mammograms were obtained. Bilateral screening digital breast
tomosynthesis was performed. The images were evaluated with
computer-aided detection.

[L MLO synth-2D]
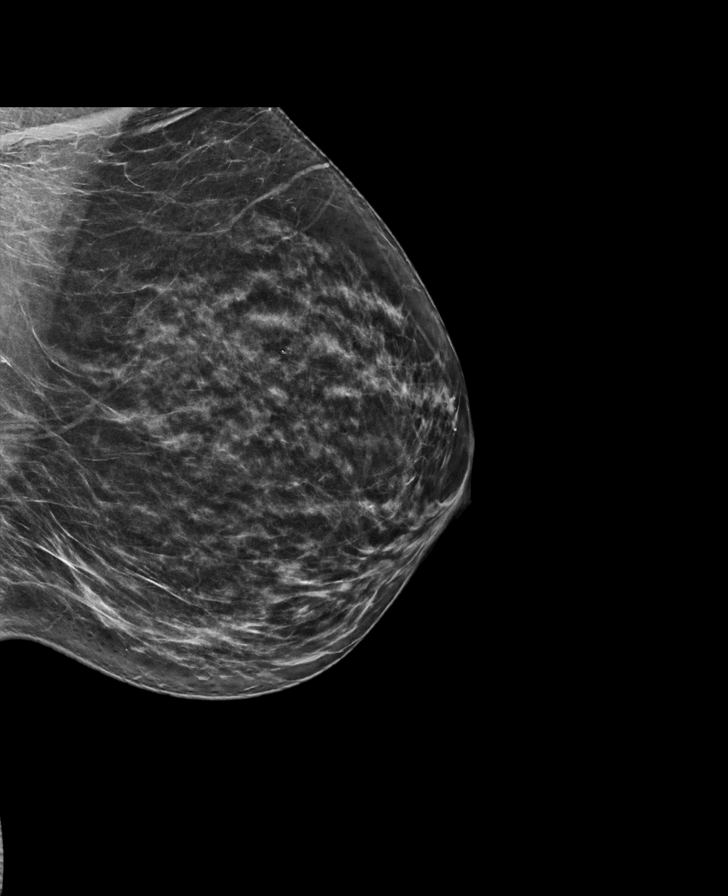

[R MLO synth-2D]
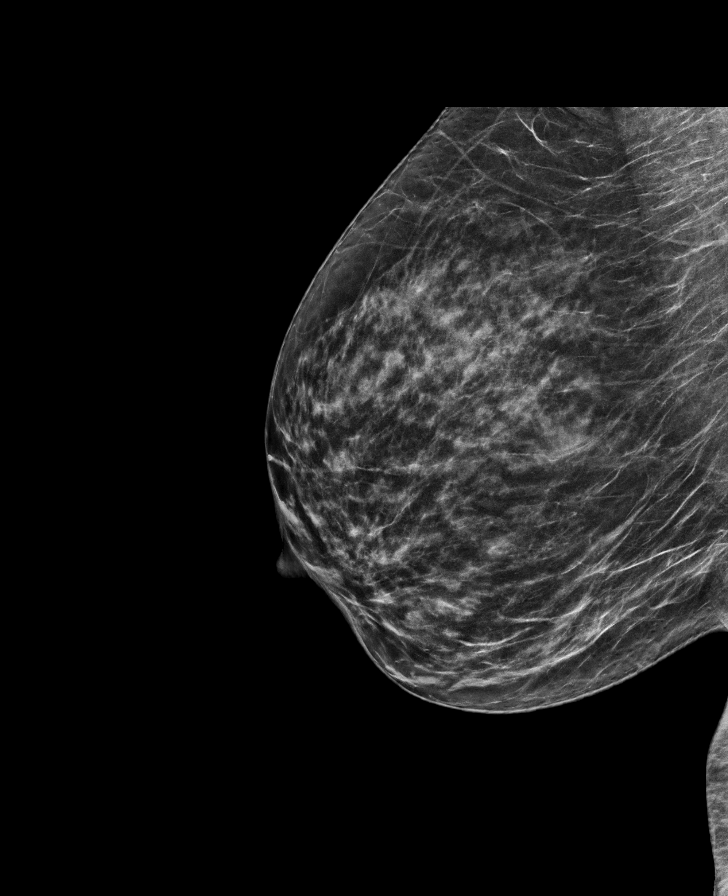

[R CC synth-2D]
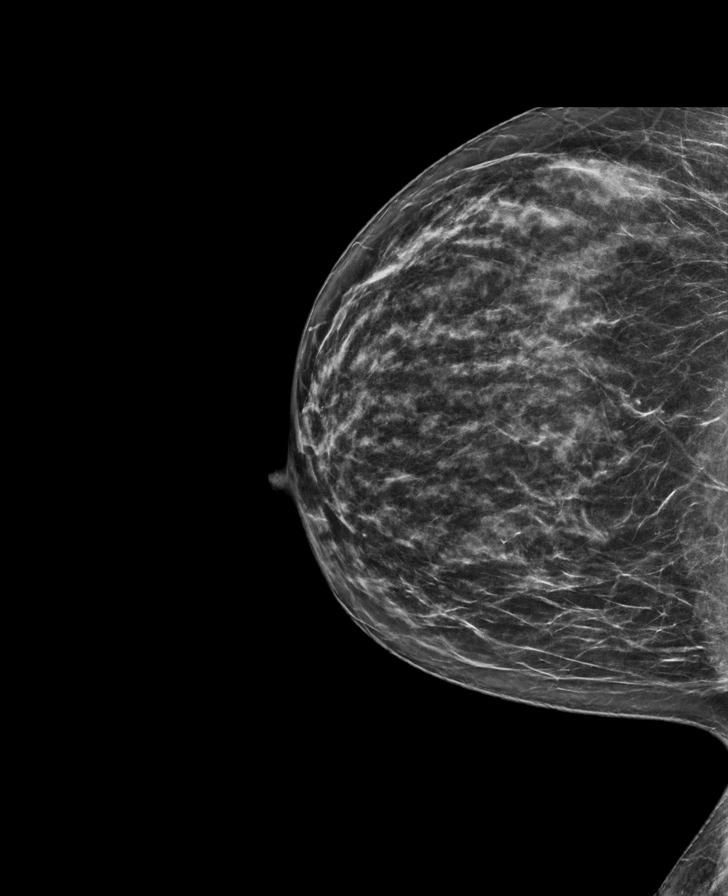

[L CC synth-2D]
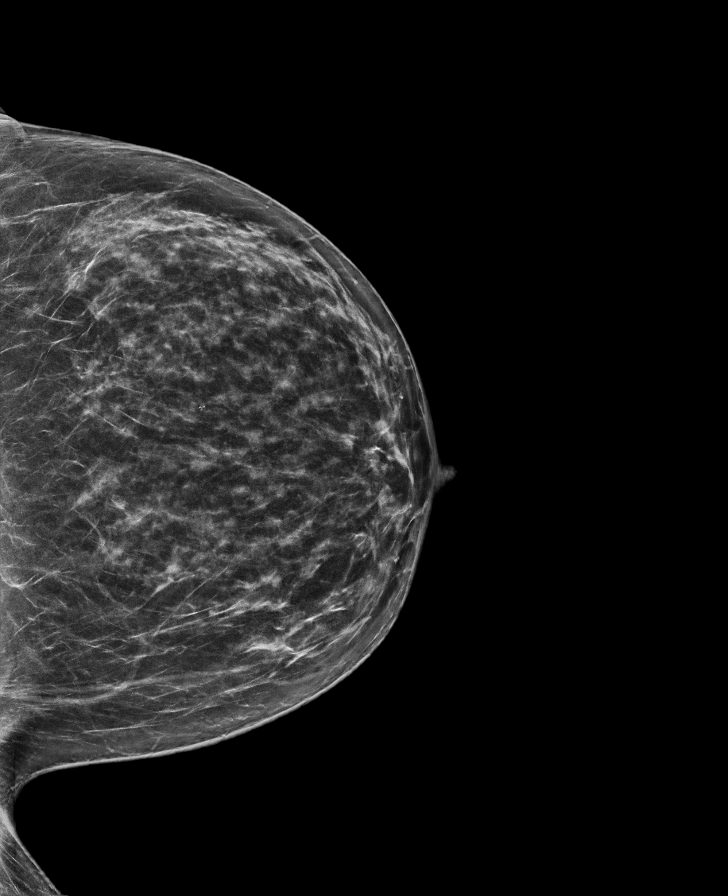

[R MLO tomo · tomo slice 32/63.0]
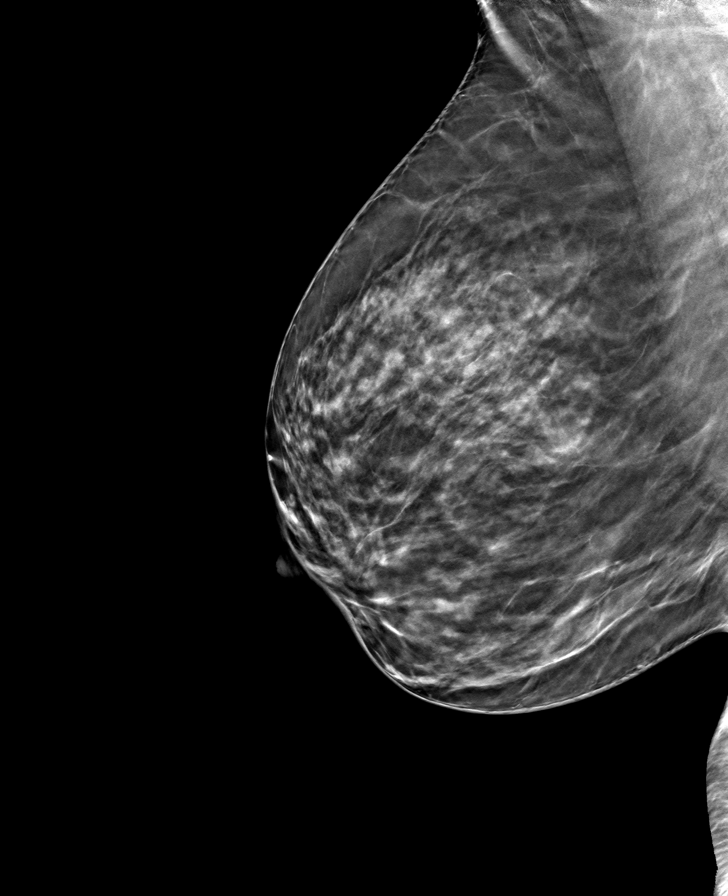

[L MLO tomo · tomo slice 33/66.0]
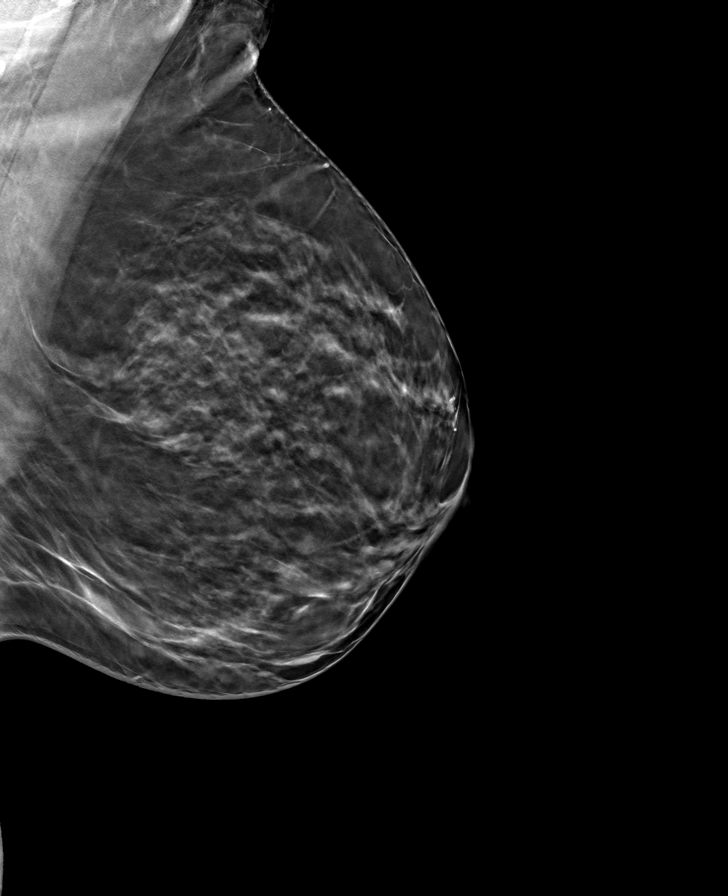

[R CC tomo · tomo slice 32/63.0]
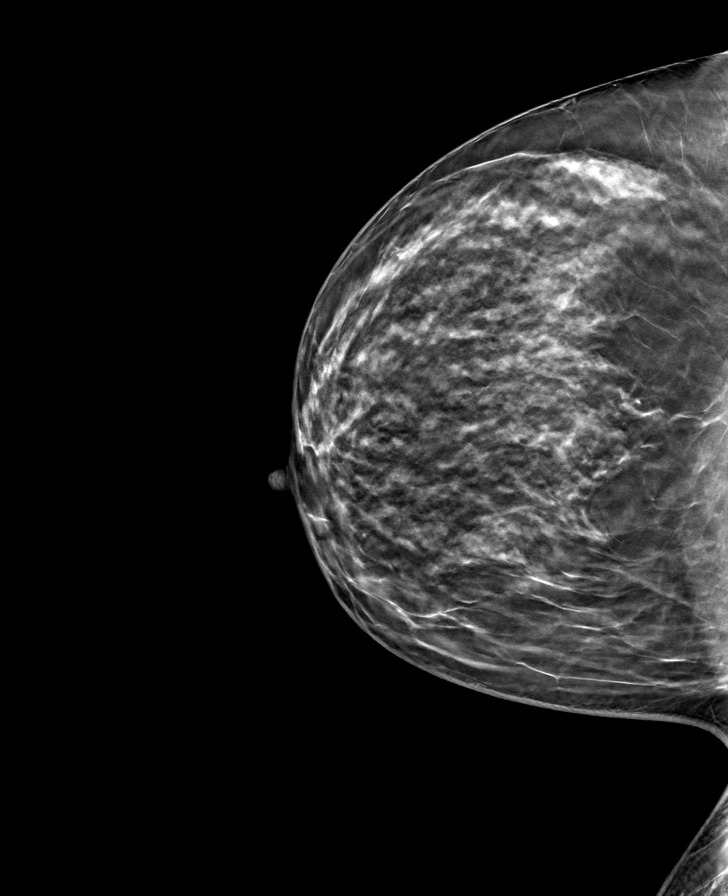

[L CC tomo · tomo slice 33/66.0]
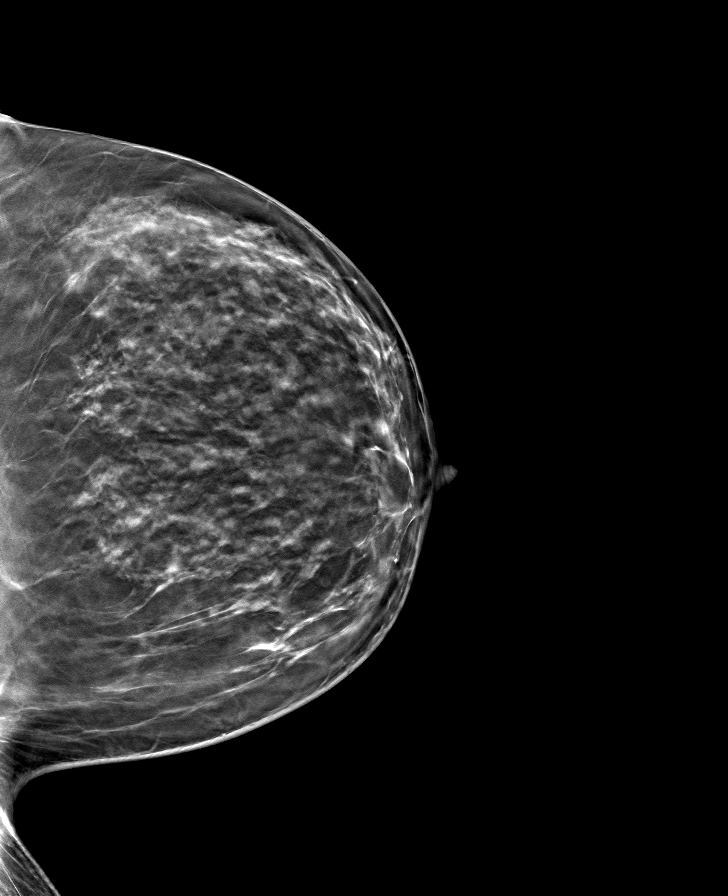

[8 of 24 positions shown; findings below may reference images not displayed]

ACR Breast Density Category c: The breast tissue is heterogeneously
dense, which may obscure small masses.
FINDINGS: There are no findings suspicious for malignancy.
IMPRESSION: No mammographic evidence of malignancy. A result letter of this
screening mammogram will be mailed directly to the patient.

RECOMMENDATION:
Screening mammogram in one year. (Code:Q3-W-BC3)

BI-RADS CATEGORY  1: Negative.

## 2023-12-28 ENCOUNTER — Other Ambulatory Visit: Payer: Self-pay | Admitting: Obstetrics and Gynecology

## 2023-12-28 DIAGNOSIS — Z1231 Encounter for screening mammogram for malignant neoplasm of breast: Secondary | ICD-10-CM

## 2024-01-10 DIAGNOSIS — Z124 Encounter for screening for malignant neoplasm of cervix: Secondary | ICD-10-CM | POA: Diagnosis not present

## 2024-01-10 DIAGNOSIS — Z1151 Encounter for screening for human papillomavirus (HPV): Secondary | ICD-10-CM | POA: Diagnosis not present

## 2024-01-10 DIAGNOSIS — N912 Amenorrhea, unspecified: Secondary | ICD-10-CM | POA: Diagnosis not present

## 2024-01-10 DIAGNOSIS — Z6826 Body mass index (BMI) 26.0-26.9, adult: Secondary | ICD-10-CM | POA: Diagnosis not present

## 2024-01-10 DIAGNOSIS — Z01419 Encounter for gynecological examination (general) (routine) without abnormal findings: Secondary | ICD-10-CM | POA: Diagnosis not present

## 2024-01-12 ENCOUNTER — Ambulatory Visit
Admission: RE | Admit: 2024-01-12 | Discharge: 2024-01-12 | Disposition: A | Discharge status: Home / Self Care | Source: Ambulatory Visit | Attending: Obstetrics and Gynecology | Admitting: Obstetrics and Gynecology

## 2024-01-12 DIAGNOSIS — Z1231 Encounter for screening mammogram for malignant neoplasm of breast: Secondary | ICD-10-CM

## 2024-01-31 DIAGNOSIS — N939 Abnormal uterine and vaginal bleeding, unspecified: Secondary | ICD-10-CM | POA: Diagnosis not present

## 2024-02-29 DIAGNOSIS — N950 Postmenopausal bleeding: Secondary | ICD-10-CM | POA: Diagnosis not present

## 2024-02-29 DIAGNOSIS — N840 Polyp of corpus uteri: Secondary | ICD-10-CM | POA: Diagnosis not present

## 2024-03-14 DIAGNOSIS — Z4889 Encounter for other specified surgical aftercare: Secondary | ICD-10-CM | POA: Diagnosis not present

## 2024-05-21 DIAGNOSIS — N951 Menopausal and female climacteric states: Secondary | ICD-10-CM | POA: Diagnosis not present

## 2024-08-20 ENCOUNTER — Ambulatory Visit (INDEPENDENT_AMBULATORY_CARE_PROVIDER_SITE_OTHER): Payer: 59 | Admitting: Family Medicine

## 2024-08-20 VITALS — BP 132/82 | HR 69 | Temp 98.3°F | Ht 65.0 in | Wt 156.0 lb

## 2024-08-20 DIAGNOSIS — E663 Overweight: Secondary | ICD-10-CM | POA: Diagnosis not present

## 2024-08-20 DIAGNOSIS — Z0000 Encounter for general adult medical examination without abnormal findings: Secondary | ICD-10-CM | POA: Diagnosis not present

## 2024-08-20 LAB — CBC WITH DIFFERENTIAL/PLATELET
Basophils Absolute: 0 K/uL (ref 0.0–0.1)
Basophils Relative: 1 % (ref 0.0–3.0)
Eosinophils Absolute: 0.1 K/uL (ref 0.0–0.7)
Eosinophils Relative: 2.3 % (ref 0.0–5.0)
HCT: 41 % (ref 36.0–46.0)
Hemoglobin: 13.7 g/dL (ref 12.0–15.0)
Lymphocytes Relative: 26 % (ref 12.0–46.0)
Lymphs Abs: 1.2 K/uL (ref 0.7–4.0)
MCHC: 33.4 g/dL (ref 30.0–36.0)
MCV: 90.5 fl (ref 78.0–100.0)
Monocytes Absolute: 0.3 K/uL (ref 0.1–1.0)
Monocytes Relative: 6.1 % (ref 3.0–12.0)
Neutro Abs: 3 K/uL (ref 1.4–7.7)
Neutrophils Relative %: 64.6 % (ref 43.0–77.0)
Platelets: 331 K/uL (ref 150.0–400.0)
RBC: 4.53 Mil/uL (ref 3.87–5.11)
RDW: 12.8 % (ref 11.5–15.5)
WBC: 4.7 K/uL (ref 4.0–10.5)

## 2024-08-20 LAB — BASIC METABOLIC PANEL WITH GFR
BUN: 18 mg/dL (ref 6–23)
CO2: 29 meq/L (ref 19–32)
Calcium: 9.5 mg/dL (ref 8.4–10.5)
Chloride: 101 meq/L (ref 96–112)
Creatinine, Ser: 0.97 mg/dL (ref 0.40–1.20)
GFR: 66.66 mL/min
Glucose, Bld: 99 mg/dL (ref 70–99)
Potassium: 4.4 meq/L (ref 3.5–5.1)
Sodium: 139 meq/L (ref 135–145)

## 2024-08-20 LAB — LIPID PANEL
Cholesterol: 200 mg/dL (ref 28–200)
HDL: 71.3 mg/dL
LDL Cholesterol: 103 mg/dL — ABNORMAL HIGH (ref 10–99)
NonHDL: 128.85
Total CHOL/HDL Ratio: 3
Triglycerides: 127 mg/dL (ref 10.0–149.0)
VLDL: 25.4 mg/dL (ref 0.0–40.0)

## 2024-08-20 LAB — HEPATIC FUNCTION PANEL
ALT: 19 U/L (ref 3–35)
AST: 17 U/L (ref 5–37)
Albumin: 4.7 g/dL (ref 3.5–5.2)
Alkaline Phosphatase: 79 U/L (ref 39–117)
Bilirubin, Direct: 0.1 mg/dL (ref 0.1–0.3)
Total Bilirubin: 0.6 mg/dL (ref 0.2–1.2)
Total Protein: 6.9 g/dL (ref 6.0–8.3)

## 2024-08-20 LAB — TSH: TSH: 2.35 u[IU]/mL (ref 0.35–5.50)

## 2024-08-20 NOTE — Patient Instructions (Signed)
 Follow up in 1 year or as needed We'll notify you of your lab results and make any changes if needed Keep up the good work on healthy diet and regular exercise- you look great! Call with any questions or concerns Stay Safe!  Stay Healthy! Happy New Year!!

## 2024-08-20 NOTE — Progress Notes (Signed)
" ° °  Subjective:    Patient ID: Rhonda Dennis, female    DOB: Apr 03, 1971, 53 y.o.   MRN: [id number]  HPI CPE- UTD on mammo, colonoscopy, pap, Tdap.  Pt reports feeling good.  Health Maintenance  Topic Date Due   Influenza Vaccine  11/20/2024 (Originally 03/23/2024)   Pneumococcal Vaccine: 50+ Years (1 of 1 - PCV) 08/20/2025 (Originally 01/05/2021)   Hepatitis B Vaccines 19-59 Average Risk (1 of 3 - 19+ 3-dose series) 08/20/2025 (Originally 01/05/1990)   Mammogram  01/11/2025   Colonoscopy  10/17/2025   Cervical Cancer Screening (HPV/Pap Cotest)  10/21/2025   DTaP/Tdap/Td (2 - Td or Tdap) 12/24/2025   Hepatitis C Screening  Completed   HIV Screening  Completed   Zoster Vaccines- Shingrix   Completed   HPV VACCINES  Aged Out   Meningococcal B Vaccine  Aged Out   COVID-19 Vaccine  Discontinued    Patient Care Team    Relationship Specialty Notifications Start End  Mahlon Comer BRAVO, MD PCP - General Family Medicine  05/13/17   Leva Rush, MD Consulting Physician Obstetrics and Gynecology  05/13/17       Review of Systems Patient reports no vision/ hearing changes, adenopathy,fever, weight change,  persistant/recurrent hoarseness , swallowing issues, chest pain, palpitations, edema, persistant/recurrent cough, hemoptysis, dyspnea (rest/exertional/paroxysmal nocturnal), gastrointestinal bleeding (melena, rectal bleeding), abdominal pain, significant heartburn, bowel changes, GU symptoms (dysuria, hematuria, incontinence), Gyn symptoms (abnormal  bleeding, pain),  syncope, focal weakness, memory loss, numbness & tingling, skin/hair/nail changes, abnormal bruising or bleeding, anxiety, or depression.     Objective:   Physical Exam General Appearance:    Alert, cooperative, no distress, appears stated age  Head:    Normocephalic, without obvious abnormality, atraumatic  Eyes:    PERRL, conjunctiva/corneas clear, EOM's intact both eyes  Ears:    Normal TM's and external ear canals,  both ears  Nose:   Nares normal, septum midline, mucosa normal, no drainage    or sinus tenderness  Throat:   Lips, mucosa, and tongue normal; teeth and gums normal  Neck:   Supple, symmetrical, trachea midline, no adenopathy;    Thyroid : no enlargement/tenderness/nodules  Back:     Symmetric, no curvature, ROM normal, no CVA tenderness  Lungs:     Clear to auscultation bilaterally, respirations unlabored  Chest Wall:    No tenderness or deformity   Heart:    Regular rate and rhythm, S1 and S2 normal, no murmur, rub   or gallop  Breast Exam:    Deferred to GYN  Abdomen:     Soft, non-tender, bowel sounds active all four quadrants,    no masses, no organomegaly  Genitalia:    Deferred to GYN  Rectal:    Extremities:   Extremities normal, atraumatic, no cyanosis or edema  Pulses:   2+ and symmetric all extremities  Skin:   Skin color, texture, turgor normal, no rashes or lesions  Lymph nodes:   Cervical, supraclavicular, and axillary nodes normal  Neurologic:   CNII-XII intact, normal strength, sensation and reflexes    throughout          Assessment & Plan:    "

## 2024-08-20 NOTE — Assessment & Plan Note (Signed)
Pt's PE WNL.  UTD on pap, mammo, colonoscopy, Tdap.  Check labs.  Anticipatory guidance provided.  

## 2024-08-22 ENCOUNTER — Ambulatory Visit: Payer: Self-pay | Admitting: Family Medicine

## 2025-08-21 ENCOUNTER — Encounter: Admitting: Family Medicine
